# Patient Record
Sex: Female | Born: 1940 | Race: White | Hispanic: No | Marital: Married | Smoking: Never smoker
Health system: Southern US, Community
[De-identification: ages and names within clinical notes are randomized; demographics above are authoritative.]

## PROBLEM LIST (undated history)

## (undated) DIAGNOSIS — M1990 Unspecified osteoarthritis, unspecified site: Secondary | ICD-10-CM

## (undated) DIAGNOSIS — E785 Hyperlipidemia, unspecified: Secondary | ICD-10-CM

## (undated) DIAGNOSIS — I10 Essential (primary) hypertension: Secondary | ICD-10-CM

## (undated) DIAGNOSIS — G4730 Sleep apnea, unspecified: Secondary | ICD-10-CM

## (undated) DIAGNOSIS — C801 Malignant (primary) neoplasm, unspecified: Secondary | ICD-10-CM

## (undated) HISTORY — PX: CHOLECYSTECTOMY: SHX55

## (undated) HISTORY — DX: Essential (primary) hypertension: I10

## (undated) HISTORY — PX: VAGINAL HYSTERECTOMY: SUR661

## (undated) HISTORY — DX: Hyperlipidemia, unspecified: E78.5

## (undated) HISTORY — PX: OOPHORECTOMY: SHX6387

## (undated) HISTORY — DX: Sleep apnea, unspecified: G47.30

---

## 1998-10-02 ENCOUNTER — Other Ambulatory Visit: Admission: RE | Admit: 1998-10-02 | Discharge: 1998-10-02 | Payer: Self-pay | Admitting: Obstetrics and Gynecology

## 1998-10-18 ENCOUNTER — Other Ambulatory Visit: Admission: RE | Admit: 1998-10-18 | Discharge: 1998-10-18 | Payer: Self-pay | Admitting: *Deleted

## 2001-11-20 ENCOUNTER — Other Ambulatory Visit: Admission: RE | Admit: 2001-11-20 | Discharge: 2001-11-20 | Payer: Self-pay | Admitting: Obstetrics and Gynecology

## 2002-10-17 ENCOUNTER — Encounter: Payer: Self-pay | Admitting: General Surgery

## 2002-10-21 ENCOUNTER — Observation Stay (HOSPITAL_COMMUNITY): Admission: RE | Admit: 2002-10-21 | Discharge: 2002-10-22 | Payer: Self-pay | Admitting: General Surgery

## 2002-10-21 ENCOUNTER — Encounter: Payer: Self-pay | Admitting: General Surgery

## 2002-10-22 ENCOUNTER — Encounter (INDEPENDENT_AMBULATORY_CARE_PROVIDER_SITE_OTHER): Payer: Self-pay | Admitting: Specialist

## 2003-04-02 ENCOUNTER — Encounter: Payer: Self-pay | Admitting: Emergency Medicine

## 2003-04-02 ENCOUNTER — Emergency Department (HOSPITAL_COMMUNITY): Admission: EM | Admit: 2003-04-02 | Discharge: 2003-04-02 | Payer: Self-pay | Admitting: Emergency Medicine

## 2003-04-08 ENCOUNTER — Encounter: Admission: RE | Admit: 2003-04-08 | Discharge: 2003-05-06 | Payer: Self-pay | Admitting: Specialist

## 2003-05-29 ENCOUNTER — Other Ambulatory Visit: Admission: RE | Admit: 2003-05-29 | Discharge: 2003-05-29 | Payer: Self-pay | Admitting: Obstetrics and Gynecology

## 2005-01-10 ENCOUNTER — Encounter: Admission: RE | Admit: 2005-01-10 | Discharge: 2005-01-10 | Payer: Self-pay | Admitting: Obstetrics and Gynecology

## 2005-03-24 ENCOUNTER — Other Ambulatory Visit: Admission: RE | Admit: 2005-03-24 | Discharge: 2005-03-24 | Payer: Self-pay | Admitting: Obstetrics and Gynecology

## 2005-06-07 ENCOUNTER — Ambulatory Visit (HOSPITAL_COMMUNITY): Admission: RE | Admit: 2005-06-07 | Discharge: 2005-06-07 | Payer: Self-pay | Admitting: Gastroenterology

## 2005-06-07 ENCOUNTER — Encounter (INDEPENDENT_AMBULATORY_CARE_PROVIDER_SITE_OTHER): Payer: Self-pay | Admitting: *Deleted

## 2006-06-30 ENCOUNTER — Encounter: Admission: RE | Admit: 2006-06-30 | Discharge: 2006-06-30 | Payer: Self-pay | Admitting: Obstetrics and Gynecology

## 2009-01-01 ENCOUNTER — Encounter: Admission: RE | Admit: 2009-01-01 | Discharge: 2009-01-01 | Payer: Self-pay | Admitting: Obstetrics and Gynecology

## 2010-01-07 ENCOUNTER — Encounter: Admission: RE | Admit: 2010-01-07 | Discharge: 2010-01-07 | Payer: Self-pay | Admitting: Family Medicine

## 2011-01-06 ENCOUNTER — Other Ambulatory Visit: Payer: Self-pay | Admitting: Family Medicine

## 2011-01-06 DIAGNOSIS — Z139 Encounter for screening, unspecified: Secondary | ICD-10-CM

## 2011-01-12 ENCOUNTER — Other Ambulatory Visit: Payer: Self-pay | Admitting: Gastroenterology

## 2011-01-14 NOTE — Op Note (Signed)
Kiara Zamora, Kiara Zamora              ACCOUNT NO.:  [id number]   MEDICAL RECORD NO.:  [id number]          PATIENT TYPE:  AMB   LOCATION:  ENDO                         FACILITY:  [facility]   PHYSICIAN:  Bernette Redbird, M.D.   DATE OF BIRTH:  Jun 24, 1941   DATE OF PROCEDURE:  06/07/2005  DATE OF DISCHARGE:                                 OPERATIVE REPORT   PROCEDURE:  Colonoscopy with polypectomy and biopsy.   INDICATIONS:  A 70 year old female for initial colon cancer screening. There  is a possible family history of colon cancer in her father.   FINDINGS:  Two small polyps removed.   DESCRIPTION OF PROCEDURE:  The nature, purpose and risks of the procedure  had been discussed with the patient who provided written consent. Sedation  was fentanyl 100 mcg and Versed 8 mg IV without arrhythmias or desaturation.   The Olympus adjustable tension pediatric video colonoscope was advanced with  moderate looping around colon to the terminal ileum (it is felt that the  adult colonoscope may be more suitable on future occasions). External  abdominal compression was used to control looping and facilitate  advancement.   In the cecum was a 5 mm semipedunculated polyp removed by cold snare  technique and retrieved by suctioning through the scope. In the mid rectum  was a 3-mm sessile polyp removed by cold biopsy. No other polyps were seen  and there was no evidence of cancer, colitis, vascular malformations or  diverticulosis. The quality of the prep was excellent and it is felt that  all areas were well seen. Retroflexion in the rectum was unremarkable.   The patient tolerated the procedure well and there were no apparent  complications.   IMPRESSION:  Small colon polyps removed as described above. Possible family  history of colon cancer.   PLAN:  Await pathology results. Probable colonoscopic follow-up in 5 years.           ______________________________  Bernette Redbird, M.D.     RB/MEDQ   D:  06/07/2005  T:  06/07/2005  Job:  [id number]   cc:   Duke Salvia. Marcelle Overlie, M.D.  Fax: [phone]

## 2011-01-14 NOTE — Op Note (Signed)
NAMEPANHIA, KARL                          ACCOUNT NO.:  [id number]   MEDICAL RECORD NO.:  [id number]                   PATIENT TYPE:  AMB   LOCATION:  DAY                                  FACILITY:  [facility]   PHYSICIAN:  Ollen Gross. Vernell Morgans, M.D.              DATE OF BIRTH:  08-14-1941   DATE OF PROCEDURE:  10/21/2002  DATE OF DISCHARGE:                                 OPERATIVE REPORT   PREOPERATIVE DIAGNOSIS:  Cholelithiasis .   POSTOPERATIVE DIAGNOSES:  Cholecystitis with cholelithiasis.   PROCEDURE:  Laparoscopic cholecystectomy with interoperative cholangiogram.   SURGEON:  Ollen Gross. Carolynne Edouard, M.D.   ASSISTANT:  Donnie Coffin. Samuella Cota, M.D.   ANESTHESIA:  General endotracheal.   DESCRIPTION OF PROCEDURE:  After informed consent was obtained, the patient  was brought to the operating room and placed in the supine position on the  operating table.  After adequate general anesthesia, the patient's abdomen  was prepped with Betadine and draped in the usual sterile manner.  The area  below the umbilicus was infiltrated with 0.25% Marcaine, and a small  incision was made with a 15 blade knife.  This incision was carried down  through the subcutaneous tissue bluntly with the Kelly clamp and [employer]  retractors, until the linea alba was identified.  The linea alba was incised  with a 15 blade knife, and each side was grasped with Kocher clamps and  elevated anteriorly.  The preperitoneal space was then probed bluntly with a  hemostat until the peritoneum was opened, and access was gained to the  abdominal cavity.  A 0 Vicryl pursestring stitch was placed in the fascia  surrounding this opening, and a Hasson cannula was placed through the  opening and anchored in place with the previously-placed Vicryl pursestring  stitch.  The abdomen was then insufflated with carbon dioxide without  difficulty.  The patient was placed in a head-up position, and a laparoscope  was placed through the Hasson  cannula.  The dome of the gallbladder and the  liver edge were readily identified.  The epigastric region was then  infiltrated with 0.25% Marcaine, and a small incision was made with a 15  blade knife.  A 10-mm port was placed bluntly through this incision into the  abdominal cavity under direct vision.  Sites were then chosen laterally in  the right side of the abdomen for placement of 5-mm ports.  Each of these  areas was infiltrated with 0.25% Marcaine.  Small stab incisions were made  with a 15 blade knife, and 5-mm ports were placed bluntly through these  incisions into the abdominal cavity under direct vision.  The gallbladder  was very tense and distended.  The gallbladder was decompressed using a  needle aspiration device.  Once this was complete, the dome of the  gallbladder was able to be grasped with a blunt grasper and elevated  anteriorly and  superiorly.  A blunt grasper was placed through the other 5-  mm port and used to retract the body and neck of the gallbladder.  The  dissector was used to open the peritoneal reflection at the gallbladder neck  area using the electrocautery, and then blunt dissection was then carried  out in this area until the gallbladder neck/cystic duct junction was readily  identified.  Once this was identified and dissected circumferentially until  a good window was created, a single clip was placed on the gallbladder neck,  and a ductotomy was made with the laparoscopic scissors, and a 12-gauge  Angiocath was placed percutaneously through the anterior abdominal wall.  A  Reddick cholangiogram catheter was placed through the Angiocath and flushed,  and the catheter was then placed within the cystic duct and anchored in  place with a clip.  A cholangiogram was obtained that showed no filling  defects, a short cystic duct, and good emptying into the duodenum.  The  anchor clip and catheters were then removed from the patient.  Three clips  were placed  proximally on the cystic duct, and the duct was divided between  the two sets of clips.  There were multiple branches of the arteries  identified posterior to this, and each branch was dissected  circumferentially in a blunt manner.  Two clips were placed proximally and  one distally on each branch, and the branches were divided between the two  sets of clips.  Next, a laparoscopic Hook cautery device was used to  separate the gallbladder from the liver bed.  The gallbladder was  significantly inflamed, but was able to be separated from the liver bed with  the electrocautery.  Prior to completely detaching the liver bed, the liver  bed was inspected, and bleeding points were coagulated with the  electrocautery until the liver bed was completely hemostatic.  The  gallbladder was then detached the rest of the way from the liver bed.  An  endoscopic bag was placed through the epigastric port, and the gallbladder  was placed within the bag, and the bag was sealed.  The abdomen was then  irrigated with copious amounts of saline until the effluent was clear.  The  liver bed was inspected again and still found to be hemostatic.  The  laparoscope was then moved to the upper midline port, and the gallbladder  grasper was placed through the Hasson cannula and used to grasp the open end  of the bag.  The bag with the gallbladder was then removed with the Hasson  cannula through the infraumbilical port without difficulty.  The fascial  defect was then closed with the previously-placed Vicryl pursestring stitch.  The rest of the ports were removed under direct vision and were found to be  hemostatic.  The gas was allowed to escape.  The skin incisions were all  closed with interrupted 4-0 Monocryl subcuticular stitches.  Benzoin and  Steri-Strips were applied.  The patient tolerated the procedure well.  At the end of the case, all needle, sponge, and instrument counts were correct.  The patient was then  awakened and taken to the recovery room in stable  condition.                                               Ollen Gross. Vernell Morgans, M.D.  PST/MEDQ  D:  10/21/2002  T:  10/21/2002  Job:  [id number]

## 2011-01-18 ENCOUNTER — Ambulatory Visit
Admission: RE | Admit: 2011-01-18 | Discharge: 2011-01-18 | Disposition: A | Discharge status: Home / Self Care | Payer: Medicare Other | Source: Ambulatory Visit | Attending: Family Medicine | Admitting: Family Medicine

## 2011-01-18 DIAGNOSIS — Z139 Encounter for screening, unspecified: Secondary | ICD-10-CM

## 2011-12-19 ENCOUNTER — Other Ambulatory Visit: Payer: Self-pay | Admitting: Family Medicine

## 2011-12-19 DIAGNOSIS — Z1231 Encounter for screening mammogram for malignant neoplasm of breast: Secondary | ICD-10-CM

## 2012-01-19 ENCOUNTER — Ambulatory Visit: Payer: Medicare Other

## 2012-01-24 ENCOUNTER — Ambulatory Visit
Admission: RE | Admit: 2012-01-24 | Discharge: 2012-01-24 | Disposition: A | Discharge status: Home / Self Care | Payer: Medicare Other | Source: Ambulatory Visit | Attending: Family Medicine | Admitting: Family Medicine

## 2012-01-24 DIAGNOSIS — Z1231 Encounter for screening mammogram for malignant neoplasm of breast: Secondary | ICD-10-CM

## 2013-01-09 ENCOUNTER — Other Ambulatory Visit: Payer: Self-pay

## 2013-01-09 DIAGNOSIS — Z1231 Encounter for screening mammogram for malignant neoplasm of breast: Secondary | ICD-10-CM

## 2013-01-24 ENCOUNTER — Ambulatory Visit
Admission: RE | Admit: 2013-01-24 | Discharge: 2013-01-24 | Disposition: A | Discharge status: Home / Self Care | Payer: Medicare Other | Source: Ambulatory Visit

## 2013-01-24 ENCOUNTER — Ambulatory Visit: Payer: Medicare Other

## 2013-01-24 DIAGNOSIS — Z1231 Encounter for screening mammogram for malignant neoplasm of breast: Secondary | ICD-10-CM

## 2014-01-14 ENCOUNTER — Other Ambulatory Visit: Payer: Self-pay

## 2014-01-14 DIAGNOSIS — Z1231 Encounter for screening mammogram for malignant neoplasm of breast: Secondary | ICD-10-CM

## 2014-01-28 ENCOUNTER — Encounter (INDEPENDENT_AMBULATORY_CARE_PROVIDER_SITE_OTHER): Payer: Self-pay

## 2014-01-28 ENCOUNTER — Ambulatory Visit
Admission: RE | Admit: 2014-01-28 | Discharge: 2014-01-28 | Disposition: A | Discharge status: Home / Self Care | Payer: Medicare Other | Source: Ambulatory Visit

## 2014-01-28 DIAGNOSIS — Z1231 Encounter for screening mammogram for malignant neoplasm of breast: Secondary | ICD-10-CM

## 2014-02-04 ENCOUNTER — Encounter: Payer: Self-pay | Admitting: *Deleted

## 2014-02-07 ENCOUNTER — Encounter: Payer: Self-pay | Admitting: Cardiology

## 2014-02-07 ENCOUNTER — Ambulatory Visit (INDEPENDENT_AMBULATORY_CARE_PROVIDER_SITE_OTHER): Payer: Medicare Other | Admitting: Cardiology

## 2014-02-07 ENCOUNTER — Encounter (HOSPITAL_COMMUNITY): Payer: Self-pay | Admitting: *Deleted

## 2014-02-07 VITALS — BP 118/82 | HR 76 | Ht 64.0 in | Wt 229.0 lb

## 2014-02-07 DIAGNOSIS — E663 Overweight: Secondary | ICD-10-CM | POA: Insufficient documentation

## 2014-02-07 DIAGNOSIS — R0600 Dyspnea, unspecified: Secondary | ICD-10-CM

## 2014-02-07 DIAGNOSIS — R0789 Other chest pain: Secondary | ICD-10-CM

## 2014-02-07 DIAGNOSIS — R0602 Shortness of breath: Secondary | ICD-10-CM

## 2014-02-07 DIAGNOSIS — R0609 Other forms of dyspnea: Secondary | ICD-10-CM

## 2014-02-07 DIAGNOSIS — R0989 Other specified symptoms and signs involving the circulatory and respiratory systems: Secondary | ICD-10-CM

## 2014-02-07 NOTE — Progress Notes (Signed)
HPI The patient presents for evaluation of dyspnea and chest pain.  She reports that she has had dyspnea with exertion for months if not longer.  It may or may not be getting worse.  She reports that she is SOB after walking a moderate distance or up an incline.  She does not have SOB at rest and she has no PND or orthopnea.  She does not report neck or arm pain.  She does not have palpitations, presyncope or syncope.  She has no weight gain or edema.  She reports that she has some chest discomfort but this is really a sensation of not getting a good breath.  All of her symptoms improve with stopping and resting for a few minutes.  She does get some sharp shooting chest discomfort at times.  This has been happening for about 3 months and it wakes her but does not happen at night.    Allergies  Allergen Reactions  . Penicillins     RASH  . Sudafed [Pseudoephedrine Hcl]     [facility]    Current Outpatient Prescriptions  Medication Sig Dispense Refill  . amLODipine (NORVASC) 10 MG tablet TAKE 1 TAB DAILY      . aspirin 81 MG tablet Take 81 mg by mouth daily.      . carvedilol (COREG) 6.25 MG tablet TAKE 1 TAB DAILY      . cholecalciferol (VITAMIN D) 1000 UNITS tablet Take 2,000 Units by mouth daily.      Marland Kitchen lisinopril-hydrochlorothiazide (PRINZIDE,ZESTORETIC) 20-25 MG per tablet 1 TAB DAILY      . NEXIUM 40 MG capsule I TAB DAILY      . TOVIAZ 8 MG TB24 tablet 1 TAB DAILY      . vitamin B-12 (CYANOCOBALAMIN) 1000 MCG tablet Take 1,000 mcg by mouth daily.       No current facility-administered medications for this visit.    Past Medical History  Diagnosis Date  . Sleep apnea     CPAP  . Hypertension   . Hyperlipidemia     Past Surgical History  Procedure Laterality Date  . Vaginal hysterectomy    . Cholecystectomy    . Oophorectomy      Family History  Problem Relation Age of Onset  . Colon cancer Father   . Hypertension Mother   . Esophageal cancer Mother     History    Social History  . Marital Status: Married    Spouse Name: N/A    Number of Children: 3  . Years of Education: N/A   Occupational History  . Not on file.   Social History Main Topics  . Smoking status: Never Smoker   . Smokeless tobacco: Not on file  . Alcohol Use: No  . Drug Use: No  . Sexual Activity: Yes    Partners: Male   Other Topics Concern  . Not on file   Social History Narrative   Lives at home with husband.     ROS:  Positive for reflux, fibromyalgia, extremity swelling.  Otherwise as stated in the HPI and negative for all other systems.  PHYSICAL EXAM BP 118/82  Pulse 76  Ht 5\' 4"  (1.626 m)  Wt 229 lb (103.874 kg)  BMI 39.29 kg/m2 GENERAL:  Well appearing HEENT:  Pupils equal round and reactive, fundi not visualized, oral mucosa unremarkable NECK:  No jugular venous distention, waveform within normal limits, carotid upstroke brisk and symmetric, no bruits, no thyromegaly LYMPHATICS:  No cervical,  inguinal adenopathy LUNGS:  Clear to auscultation bilaterally BACK:  No CVA tenderness CHEST:  Unremarkable HEART:  PMI not displaced or sustained,S1 and S2 within normal limits, no S3, no S4, no clicks, no rubs, no murmurs ABD:  Flat, positive bowel sounds normal in frequency in pitch, no bruits, no rebound, no guarding, no midline pulsatile mass, no hepatomegaly, no splenomegaly EXT:  2 plus pulses throughout, no edema, no cyanosis no clubbing SKIN:  No rashes no nodules NEURO:  Cranial nerves II through XII grossly intact, motor grossly intact throughout PSYCH:  Cognitively intact, oriented to person place and time   EKG:  Sinus rhythm, rate 79, leftward axis, intervals within normal limits, poor anterior R wave progression, no acute ST-T wave changes.  ASSESSMENT AND PLAN  DYSPNEA:  I suspect that this is multifactorial.  This could be related to weight and deconditioning. I will order a BNP.  If this is abnormal she will likely need a follow up echo.  We  did discuss weight loss and exercise in some detail.    CHEST PAIN:  She has atypical rather than typical symptoms.  However, with risk factors she needs screening for obstructive CAD.  I will bring the patient back for a POET (Plain Old Exercise Test). This will allow me to screen for obstructive coronary disease, risk stratify and very importantly provide a prescription for exercise.  OVERWEIGHT:  The patient understands the need to lose weight with diet and exercise. We have discussed specific strategies for this as above.

## 2014-02-07 NOTE — Patient Instructions (Signed)
The current medical regimen is effective;  continue present plan and medications.  Your physician has requested that you have an exercise tolerance test. For further information please visit [email or web address]. Please also follow instruction sheet, as given.  Please have blood work today on the 1 st floor [employer])  Follow up as needed.

## 2014-02-08 LAB — BRAIN NATRIURETIC PEPTIDE: Brain Natriuretic Peptide: 28.6 pg/mL (ref 0.0–100.0)

## 2014-02-11 ENCOUNTER — Telehealth (HOSPITAL_COMMUNITY): Payer: Self-pay

## 2014-02-13 ENCOUNTER — Ambulatory Visit (HOSPITAL_COMMUNITY)
Admission: RE | Admit: 2014-02-13 | Discharge: 2014-02-13 | Disposition: A | Discharge status: Home / Self Care | Payer: Medicare Other | Source: Ambulatory Visit | Attending: Cardiovascular Disease | Admitting: Cardiovascular Disease

## 2014-02-13 DIAGNOSIS — R0789 Other chest pain: Secondary | ICD-10-CM | POA: Insufficient documentation

## 2014-02-20 ENCOUNTER — Telehealth: Payer: Self-pay | Admitting: *Deleted

## 2014-02-20 NOTE — Telephone Encounter (Signed)
I think I answered this already. No further ischemia work up. ----- Message ----- From: Kendra Opitz. Moffitt Sent: 02/17/2014 8:26 AM To: Minus Breeding, MD  Left message for pt that no further work up is needed and to call back if questions or concerns.

## 2014-02-27 NOTE — Telephone Encounter (Signed)
Encounter complete. 

## 2014-03-03 ENCOUNTER — Encounter (HOSPITAL_COMMUNITY): Payer: Self-pay | Admitting: *Deleted

## 2014-03-04 ENCOUNTER — Encounter (HOSPITAL_COMMUNITY): Payer: Self-pay | Admitting: *Deleted

## 2014-03-13 NOTE — Telephone Encounter (Signed)
Encounter complete. 

## 2015-02-20 ENCOUNTER — Other Ambulatory Visit: Payer: Self-pay

## 2015-02-20 DIAGNOSIS — Z1231 Encounter for screening mammogram for malignant neoplasm of breast: Secondary | ICD-10-CM

## 2015-03-26 ENCOUNTER — Ambulatory Visit
Admission: RE | Admit: 2015-03-26 | Discharge: 2015-03-26 | Disposition: A | Discharge status: Home / Self Care | Payer: Medicare Other | Source: Ambulatory Visit

## 2015-03-26 DIAGNOSIS — Z1231 Encounter for screening mammogram for malignant neoplasm of breast: Secondary | ICD-10-CM

## 2015-11-09 ENCOUNTER — Other Ambulatory Visit: Payer: Self-pay | Admitting: Gastroenterology

## 2015-11-09 DIAGNOSIS — R1310 Dysphagia, unspecified: Secondary | ICD-10-CM

## 2015-11-13 ENCOUNTER — Ambulatory Visit
Admission: RE | Admit: 2015-11-13 | Discharge: 2015-11-13 | Disposition: A | Discharge status: Home / Self Care | Payer: Medicare Other | Source: Ambulatory Visit | Attending: Gastroenterology | Admitting: Gastroenterology

## 2015-11-13 DIAGNOSIS — R1310 Dysphagia, unspecified: Secondary | ICD-10-CM

## 2016-05-16 ENCOUNTER — Other Ambulatory Visit: Payer: Self-pay | Admitting: Family Medicine

## 2016-05-16 DIAGNOSIS — Z1231 Encounter for screening mammogram for malignant neoplasm of breast: Secondary | ICD-10-CM

## 2016-05-26 ENCOUNTER — Ambulatory Visit
Admission: RE | Admit: 2016-05-26 | Discharge: 2016-05-26 | Disposition: A | Discharge status: Home / Self Care | Payer: Medicare Other | Source: Ambulatory Visit | Attending: Family Medicine | Admitting: Family Medicine

## 2016-05-26 DIAGNOSIS — Z1231 Encounter for screening mammogram for malignant neoplasm of breast: Secondary | ICD-10-CM

## 2017-06-05 HISTORY — PX: ESOPHAGOGASTRODUODENOSCOPY: SHX1529

## 2017-06-09 ENCOUNTER — Emergency Department (HOSPITAL_COMMUNITY)
Admission: EM | Admit: 2017-06-09 | Discharge: 2017-06-09 | Disposition: A | Discharge status: Home / Self Care | Payer: Medicare Other | Attending: Emergency Medicine | Admitting: Emergency Medicine

## 2017-06-09 ENCOUNTER — Encounter (HOSPITAL_COMMUNITY): Payer: Self-pay | Admitting: Emergency Medicine

## 2017-06-09 ENCOUNTER — Emergency Department (HOSPITAL_COMMUNITY): Payer: Medicare Other

## 2017-06-09 DIAGNOSIS — Y999 Unspecified external cause status: Secondary | ICD-10-CM | POA: Diagnosis not present

## 2017-06-09 DIAGNOSIS — I10 Essential (primary) hypertension: Secondary | ICD-10-CM | POA: Insufficient documentation

## 2017-06-09 DIAGNOSIS — Z79899 Other long term (current) drug therapy: Secondary | ICD-10-CM | POA: Insufficient documentation

## 2017-06-09 DIAGNOSIS — S62627A Displaced fracture of medial phalanx of left little finger, initial encounter for closed fracture: Secondary | ICD-10-CM | POA: Diagnosis not present

## 2017-06-09 DIAGNOSIS — W010XXA Fall on same level from slipping, tripping and stumbling without subsequent striking against object, initial encounter: Secondary | ICD-10-CM | POA: Diagnosis not present

## 2017-06-09 DIAGNOSIS — S6992XA Unspecified injury of left wrist, hand and finger(s), initial encounter: Secondary | ICD-10-CM | POA: Diagnosis present

## 2017-06-09 DIAGNOSIS — S42291A Other displaced fracture of upper end of right humerus, initial encounter for closed fracture: Secondary | ICD-10-CM

## 2017-06-09 DIAGNOSIS — Y929 Unspecified place or not applicable: Secondary | ICD-10-CM | POA: Diagnosis not present

## 2017-06-09 DIAGNOSIS — Z7982 Long term (current) use of aspirin: Secondary | ICD-10-CM | POA: Diagnosis not present

## 2017-06-09 DIAGNOSIS — Y939 Activity, unspecified: Secondary | ICD-10-CM | POA: Insufficient documentation

## 2017-06-09 MED ORDER — KETOROLAC TROMETHAMINE 15 MG/ML IJ SOLN
15.0000 mg | Freq: Once | INTRAMUSCULAR | Status: AC
  Administered 2017-06-09: 15 mg via INTRAMUSCULAR
  Filled 2017-06-09: qty 1

## 2017-06-09 MED ORDER — HYDROMORPHONE HCL 1 MG/ML IJ SOLN
1.0000 mg | Freq: Once | INTRAMUSCULAR | Status: AC
  Administered 2017-06-09: 1 mg via INTRAMUSCULAR
  Filled 2017-06-09: qty 1

## 2017-06-09 MED ORDER — PROMETHAZINE HCL 25 MG/ML IJ SOLN
12.5000 mg | Freq: Once | INTRAMUSCULAR | Status: AC
  Administered 2017-06-09: 12.5 mg via INTRAMUSCULAR
  Filled 2017-06-09: qty 1

## 2017-06-09 MED ORDER — ACETAMINOPHEN-CODEINE #3 300-30 MG PO TABS: 1.0000 | ORAL_TABLET | Freq: Four times a day (QID) | ORAL | 0 refills | Status: DC | PRN

## 2017-06-09 MED ORDER — DIAZEPAM 5 MG PO TABS: 2.5000 mg | ORAL_TABLET | Freq: Three times a day (TID) | ORAL | 0 refills | Status: DC | PRN

## 2017-06-09 MED ORDER — ONDANSETRON 4 MG PO TBDP
4.0000 mg | ORAL_TABLET | Freq: Once | ORAL | Status: AC
  Administered 2017-06-09: 4 mg via ORAL
  Filled 2017-06-09: qty 1

## 2017-06-09 NOTE — ED Notes (Signed)
ED Provider at bedside. 

## 2017-06-09 NOTE — ED Notes (Signed)
Patient transported to X-ray 

## 2017-06-09 NOTE — ED Notes (Signed)
Bed: NI77 Expected date:  Expected time:  Means of arrival:  Comments: 31F R shoulder pain r/t fall

## 2017-06-09 NOTE — Discharge Instructions (Signed)
You are not being splinted. You may remove sling for bathing purposes.

## 2017-06-09 NOTE — ED Triage Notes (Signed)
[facility]- pt with trip and fall on to right shoulder and left pinky. No deformity noted. Immobilized on arrival. No blood thinners. A/O Pain 9/10.

## 2017-06-14 NOTE — ED Provider Notes (Signed)
[facility] DEPT Provider Note   CSN: [id number] Arrival date & time: 06/09/17  3818     History   Chief Complaint Chief Complaint  Patient presents with  . Fall  . Shoulder Pain    HPI Kiara Zamora is a 76 y.o. female.  HPI   76 year old female presenting after fall. Mechanical. Tripped and lost her balance. She complaining of pain in her right upper arm/shoulder and left little finger. Denies any acute headache, neck or back pain. No numbness or tingling.  Past Medical History:  Diagnosis Date  . Hyperlipidemia   . Hypertension   . Sleep apnea    CPAP    Patient Active Problem List   Diagnosis Date Noted  . Dyspnea 02/07/2014  . Overweight(278.02) 02/07/2014    Past Surgical History:  Procedure Laterality Date  . CHOLECYSTECTOMY    . ESOPHAGOGASTRODUODENOSCOPY  06/05/2017  . OOPHORECTOMY    . VAGINAL HYSTERECTOMY      OB History    No data available       Home Medications    Prior to Admission medications   Medication Sig Start Date End Date Taking? Authorizing Provider  amLODipine (NORVASC) 10 MG tablet TAKE 1 TAB DAILY 01/21/14  Yes [provider]  aspirin 81 MG tablet Take 81 mg by mouth daily.   Yes [provider]  carvedilol (COREG) 6.25 MG tablet TAKE 1 TAB DAILY 01/21/14  Yes [provider]  cholecalciferol (VITAMIN D) 1000 UNITS tablet Take 2,000 Units by mouth daily.   Yes [provider]  lisinopril-hydrochlorothiazide (PRINZIDE,ZESTORETIC) 20-25 MG per tablet 1 TAB DAILY 01/21/14  Yes [provider]  pantoprazole (PROTONIX) 40 MG tablet Take 40 mg by mouth daily. 05/02/17  Yes [provider]  Propylene Glycol 0.95 % SOLN Apply 1-2 drops to eye daily as needed (dry eyes).   Yes [provider]  VESICARE 10 MG tablet Take 10 mg by mouth daily as needed (bladder control).  05/02/17  Yes [provider]  vitamin B-12 (CYANOCOBALAMIN) 1000  MCG tablet Take 1,000 mcg by mouth daily.   Yes [provider]  acetaminophen-codeine (TYLENOL #3) 300-30 MG tablet Take 1-2 tablets by mouth every 6 (six) hours as needed for moderate pain. 06/09/17   Virgel Manifold, MD  diazepam (VALIUM) 5 MG tablet Take 0.5 tablets (2.5 mg total) by mouth every 8 (eight) hours as needed for muscle spasms. 06/09/17   Virgel Manifold, MD    Family History Family History  Problem Relation Age of Onset  . Hypertension Mother   . Esophageal cancer Mother   . Colon cancer Father     Social History Social History  Substance Use Topics  . Smoking status: Never Smoker  . Smokeless tobacco: Never Used  . Alcohol use No     Allergies   Penicillins and Sudafed [pseudoephedrine hcl]   Review of Systems Review of Systems  All systems reviewed and negative, other than as noted in HPI.  Physical Exam Updated Vital Signs BP (!) 170/84   Pulse 78   Temp 97.8 F (36.6 C) (Oral)   Resp 19   Ht 5\' 5"  (1.651 m)   Wt 101.6 kg (224 lb)   SpO2 95%   BMI 37.28 kg/m   Physical Exam  Constitutional: She appears well-developed and well-nourished. No distress.  HENT:  Head: Normocephalic and atraumatic.  Eyes: Conjunctivae are normal. Right eye exhibits no discharge. Left eye exhibits no discharge.  Neck:  Neck supple.  Cardiovascular: Normal rate, regular rhythm and normal heart sounds.  Exam reveals no gallop and no friction rub.   No murmur heard. Pulmonary/Chest: Effort normal and breath sounds normal. No respiratory distress.  Abdominal: Soft. She exhibits no distension. There is no tenderness.  Musculoskeletal: She exhibits no edema or tenderness.  Pain and minimal deformity to right little finger in the mid/proximal phalanx. Reduced range of motion secondary to pain. Sensation is intact bilaterally in the fingertip. Good cap refill. Significant tenderness mid to proximal right humerus and lateral shoulder. Does not range secondary to pain.  No resting tach distally. No midline spinal tenderness.  Neurological: She is alert.  Skin: Skin is warm and dry.  Psychiatric: She has a normal mood and affect. Her behavior is normal. Thought content normal.  Nursing note and vitals reviewed.    ED Treatments / Results  Labs (all labs ordered are listed, but only abnormal results are displayed) Labs Reviewed - No data to display  EKG  EKG Interpretation None       Radiology No results found.   Dg Shoulder Right  Result Date: 06/09/2017 CLINICAL DATA:  76 year old female with a history of trip and fall EXAM: RIGHT SHOULDER - 2+ VIEW COMPARISON:  None. FINDINGS: Irregular contour of the humeral head without displaced fracture fragment identified. Glenohumeral joint appears congruent. Degenerative changes of the acromioclavicular joint. IMPRESSION: Irregular contour of the humeral head may reflect chronic degenerative changes, however, nondisplaced fracture could have this appearance. If there is clinical concern for occult fracture, CT may be considered. Degenerative changes of the acromioclavicular joint Electronically Signed   By: Corrie Mckusick D.O.   On: 06/09/2017 09:42   Ct Shoulder Right Wo Contrast  Result Date: 06/09/2017 CLINICAL DATA:  Right shoulder pain status post fall. EXAM: CT OF THE UPPER RIGHT EXTREMITY WITHOUT CONTRAST TECHNIQUE: Multidetector CT imaging of the upper right extremity was performed according to the standard protocol. COMPARISON:  None. FINDINGS: Bones/Joint/Cartilage Nondisplaced fracture of the surgical neck of the right proximal humerus extending into the humeral head. Comminuted fracture of the humeral head involving the greater tuberosity with 1-2 mm of peripheral displacement. No other fracture or dislocation. Mild arthropathy of the glenohumeral joint. Mild arthropathy of the acromioclavicular joint. Type II acromion. Normal alignment. No joint effusion. Ligaments Ligaments are suboptimally  evaluated by CT. Muscles and Tendons Muscles are normal.  No muscle atrophy. Soft tissue No fluid collection or hematoma.  No soft tissue mass. IMPRESSION: 1. Acute nondisplaced fracture of the surgical neck of the right proximal humerus extending into the humeral head. Comminuted fracture of the humeral head involving the greater tuberosity with 1-2 mm of peripheral displacement. Electronically Signed   By: Kathreen Devoid   On: 06/09/2017 10:55   Dg Finger Little Left  Result Date: 06/09/2017 CLINICAL DATA:  Fall tripped on step at home this a.m., landing on rt elbow and tried to catch self with lt hand complains of severe rt shoulder pain, lt little finger pain with slight deformity EXAM: LEFT LITTLE FINGER 2+V COMPARISON:  None. FINDINGS: There is an acute fracture of the mid aspect of the fifth middle phalanx, associated with contour deformity and soft tissue swelling. No evidence for articular surface involvement. IMPRESSION: Fracture of the middle phalanx of the fifth digit. Electronically Signed   By: Nolon Nations M.D.   On: 06/09/2017 09:40    Procedures Procedures (including critical care time)  Medications Ordered in ED Medications  HYDROmorphone (DILAUDID)  injection 1 mg (1 mg Intramuscular Given 06/09/17 1008)  ondansetron (ZOFRAN-ODT) disintegrating tablet 4 mg (4 mg Oral Given 06/09/17 1007)  ketorolac (TORADOL) 15 MG/ML injection 15 mg (15 mg Intramuscular Given 06/09/17 1008)  promethazine (PHENERGAN) injection 12.5 mg (12.5 mg Intramuscular Given 06/09/17 1211)     Initial Impression / Assessment and Plan / ED Course  I have reviewed the triage vital signs and the nursing notes.  Pertinent labs & imaging results that were available during my care of the patient were reviewed by me and considered in my medical decision making (see chart for details).     Left proximal humerus fracture and left pinky fracture. No resting tach. Closed injuries. Splint for finger. Sling for  arm. When necessary pain medication. Orthopedic follow-up.  Final Clinical Impressions(s) / ED Diagnoses   Final diagnoses:  Closed fracture of middle phalanx of left little finger, initial encounter  Closed fracture of head of right humerus, initial encounter    New Prescriptions Discharge Medication List as of 06/09/2017  1:00 PM    START taking these medications   Details  acetaminophen-codeine (TYLENOL #3) 300-30 MG tablet Take 1-2 tablets by mouth every 6 (six) hours as needed for moderate pain., Starting Fri 06/09/2017, Print    diazepam (VALIUM) 5 MG tablet Take 0.5 tablets (2.5 mg total) by mouth every 8 (eight) hours as needed for muscle spasms., Starting Fri 06/09/2017, Print         Virgel Manifold, MD 06/14/17 1040

## 2017-11-22 ENCOUNTER — Other Ambulatory Visit: Payer: Self-pay | Admitting: Family Medicine

## 2017-11-22 DIAGNOSIS — Z1231 Encounter for screening mammogram for malignant neoplasm of breast: Secondary | ICD-10-CM

## 2017-12-13 ENCOUNTER — Ambulatory Visit
Admission: RE | Admit: 2017-12-13 | Discharge: 2017-12-13 | Disposition: A | Discharge status: Home / Self Care | Payer: Medicare Other | Source: Ambulatory Visit | Attending: Family Medicine | Admitting: Family Medicine

## 2017-12-13 DIAGNOSIS — Z1231 Encounter for screening mammogram for malignant neoplasm of breast: Secondary | ICD-10-CM

## 2018-10-13 IMAGING — MG DIGITAL SCREENING BILATERAL MAMMOGRAM WITH TOMO AND CAD
8 series · 8 of 24 positions shown · non-contrast
Comparison: Previous exam(s).

CLINICAL DATA: Screening.

EXAM:
DIGITAL SCREENING BILATERAL MAMMOGRAM WITH TOMO AND CAD

[R CC synth-2D]
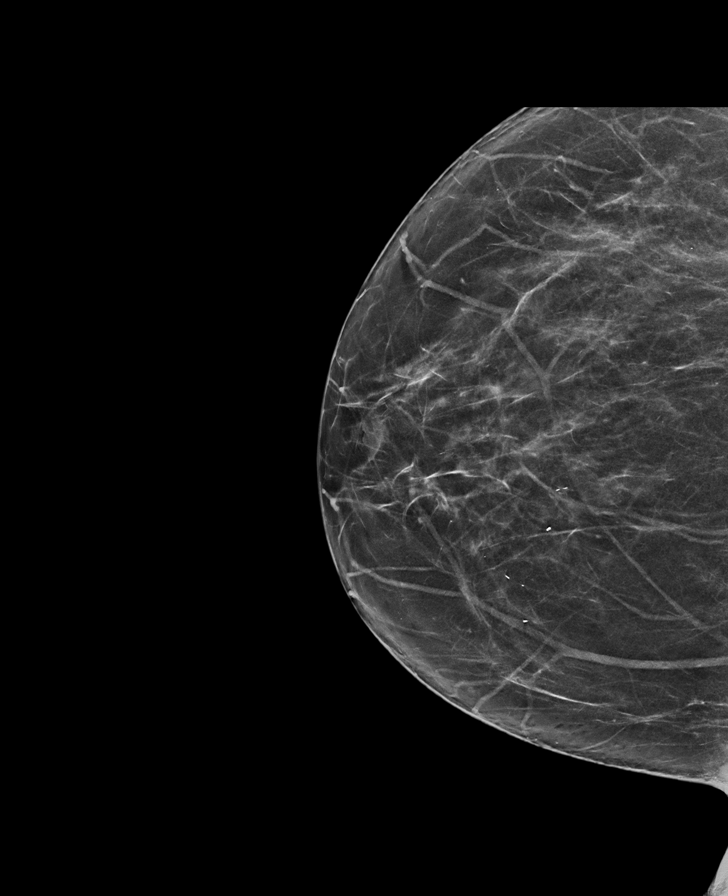

[L MLO synth-2D]
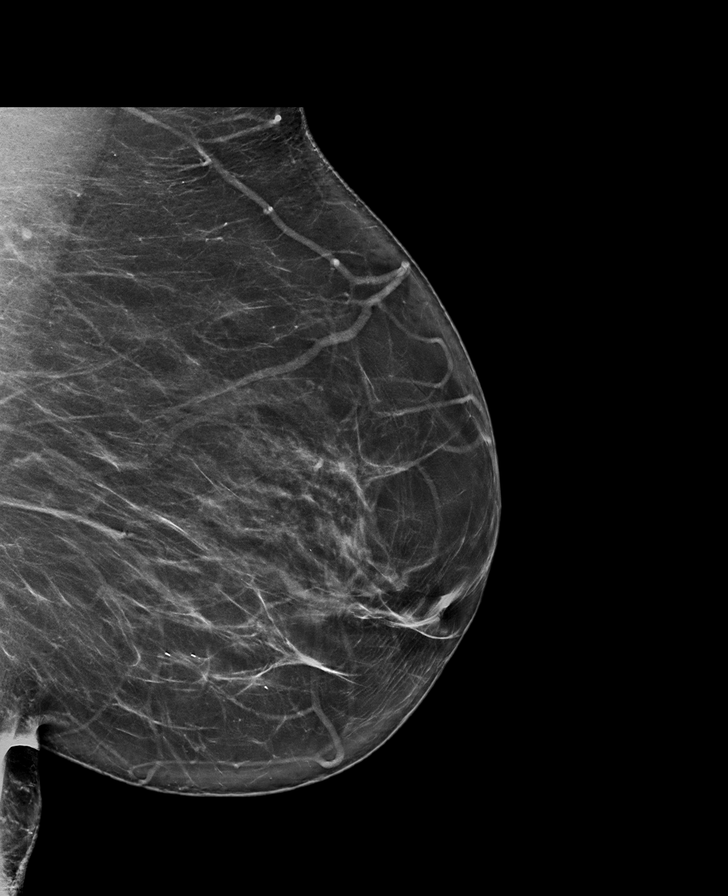

[R MLO synth-2D]
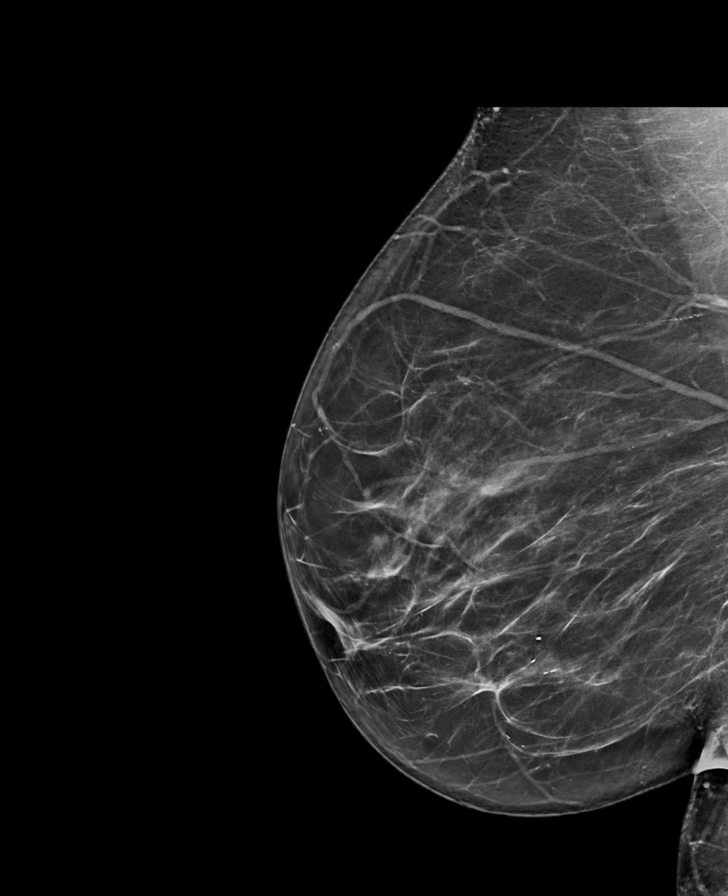

[L CC synth-2D]
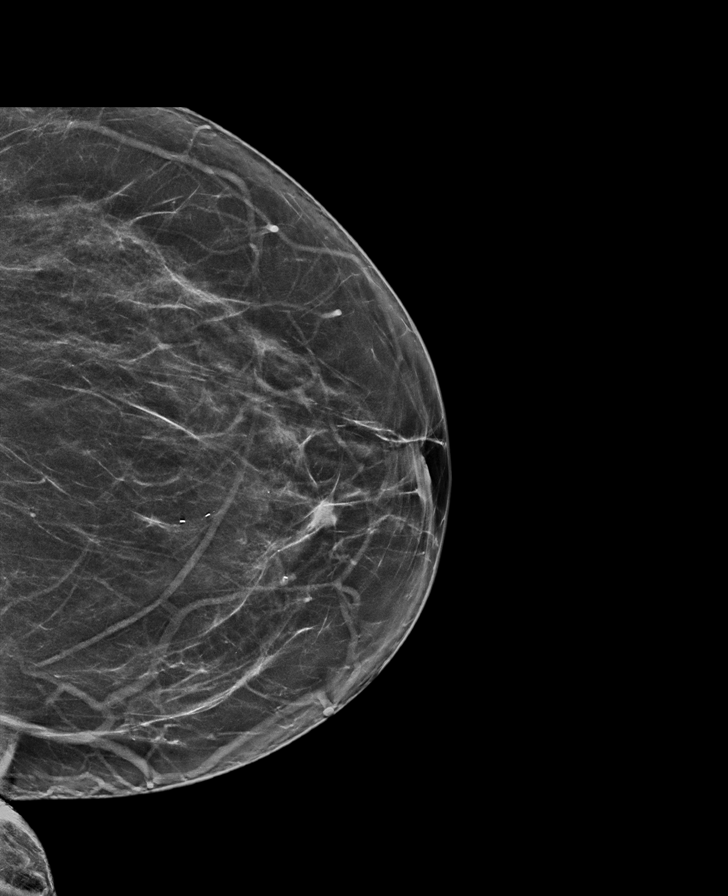

[R MLO tomo · tomo slice 39/77.0]
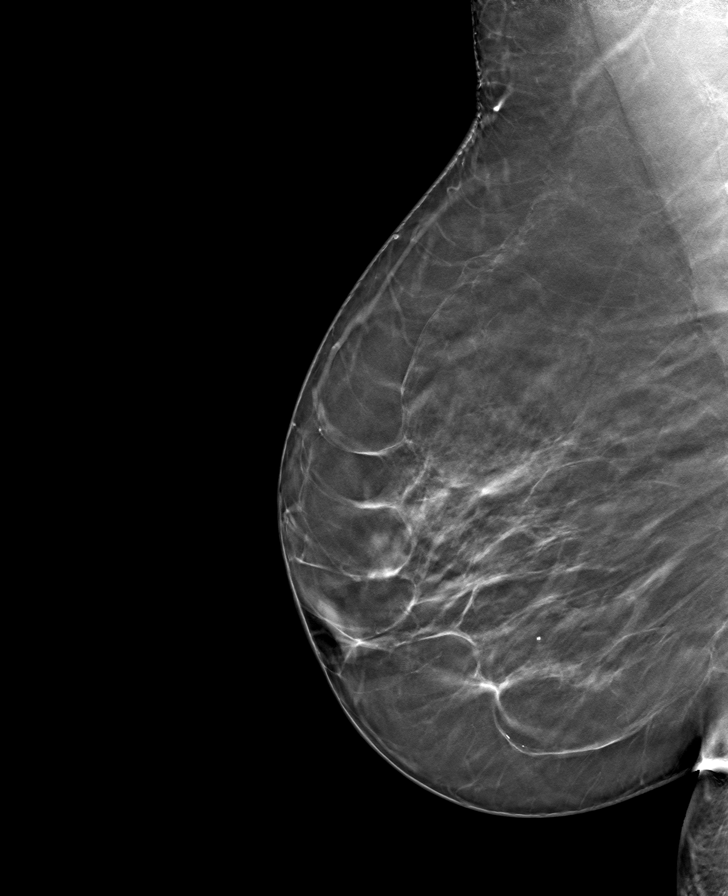

[L MLO tomo · tomo slice 41/80.0]
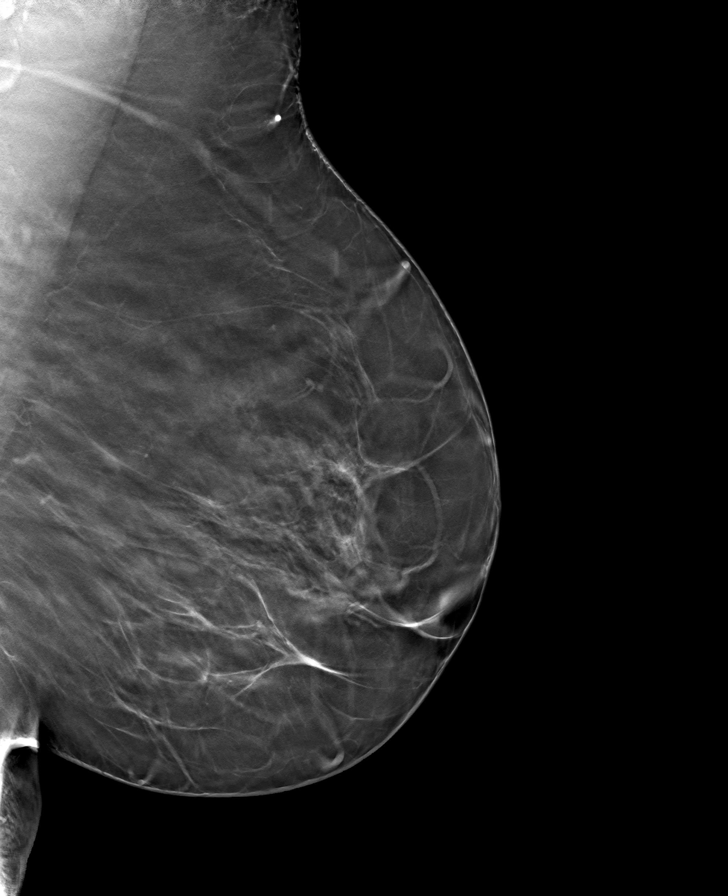

[R CC tomo · tomo slice 33/66.0]
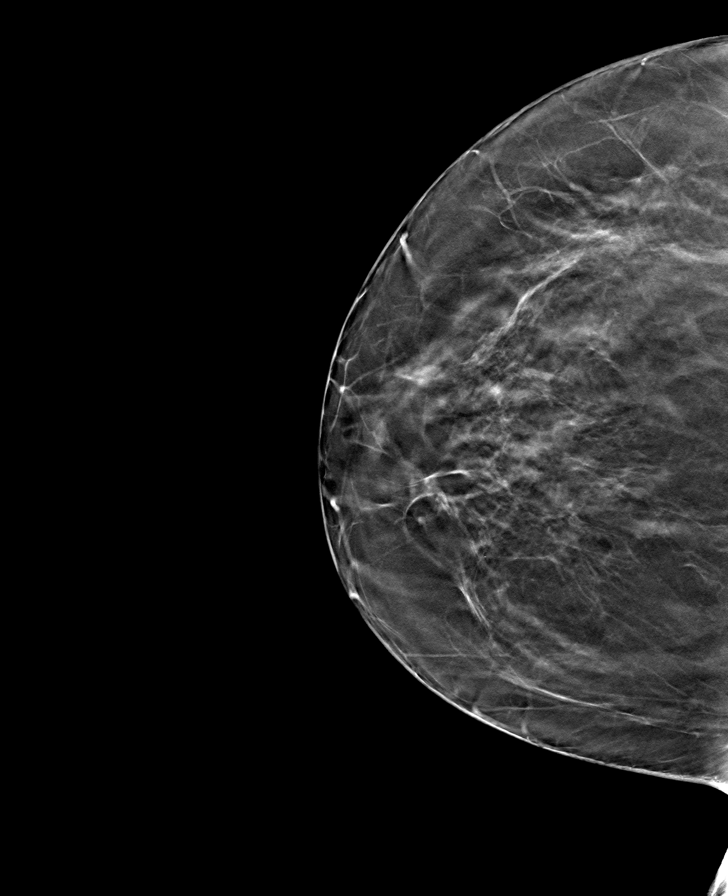

[L CC tomo · tomo slice 35/70.0]
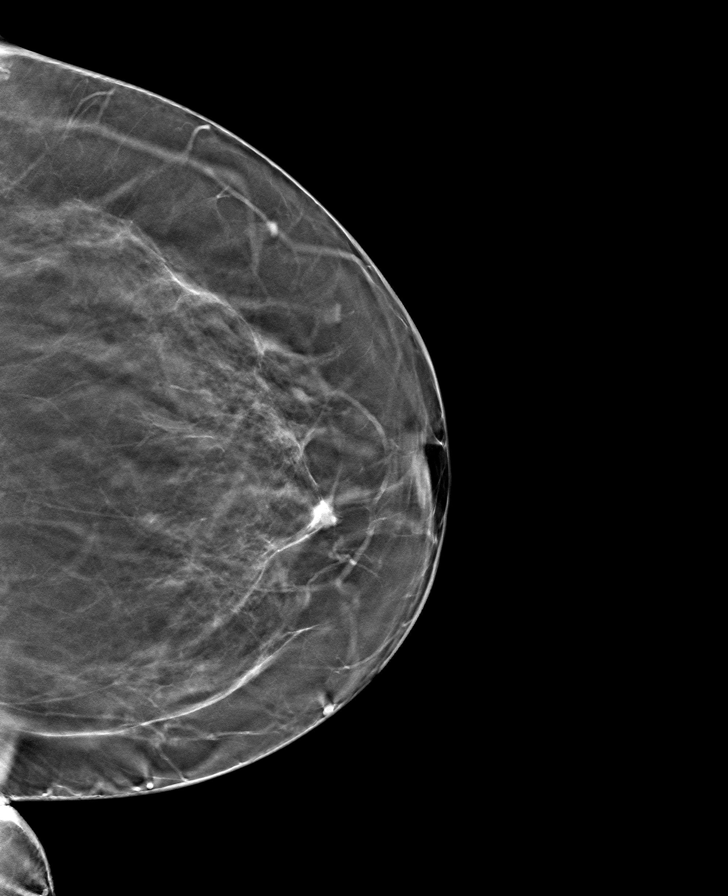

[8 of 24 positions shown; findings below may reference images not displayed]

ACR Breast Density Category b: There are scattered areas of
fibroglandular density.
FINDINGS: There are no findings suspicious for malignancy. Images were
processed with CAD.
IMPRESSION: No mammographic evidence of malignancy. A result letter of this
screening mammogram will be mailed directly to the patient.

RECOMMENDATION:
Screening mammogram in one year. (Code:[id number])

BI-RADS CATEGORY  1: Negative.

## 2019-09-23 ENCOUNTER — Other Ambulatory Visit: Payer: Self-pay

## 2019-09-23 ENCOUNTER — Other Ambulatory Visit: Payer: Self-pay | Admitting: Family Medicine

## 2019-09-23 DIAGNOSIS — Z1231 Encounter for screening mammogram for malignant neoplasm of breast: Secondary | ICD-10-CM

## 2019-10-30 ENCOUNTER — Ambulatory Visit
Admission: RE | Admit: 2019-10-30 | Discharge: 2019-10-30 | Disposition: A | Discharge status: Home / Self Care | Payer: Medicare Other | Source: Ambulatory Visit | Attending: Family Medicine | Admitting: Family Medicine

## 2019-10-30 ENCOUNTER — Other Ambulatory Visit: Payer: Self-pay

## 2019-10-30 DIAGNOSIS — Z1231 Encounter for screening mammogram for malignant neoplasm of breast: Secondary | ICD-10-CM

## 2020-09-17 DIAGNOSIS — M8580 Other specified disorders of bone density and structure, unspecified site: Secondary | ICD-10-CM | POA: Diagnosis not present

## 2020-09-17 DIAGNOSIS — N3281 Overactive bladder: Secondary | ICD-10-CM | POA: Diagnosis not present

## 2020-09-17 DIAGNOSIS — G4730 Sleep apnea, unspecified: Secondary | ICD-10-CM | POA: Diagnosis not present

## 2020-09-17 DIAGNOSIS — I10 Essential (primary) hypertension: Secondary | ICD-10-CM | POA: Diagnosis not present

## 2020-09-17 DIAGNOSIS — E7800 Pure hypercholesterolemia, unspecified: Secondary | ICD-10-CM | POA: Diagnosis not present

## 2020-09-17 DIAGNOSIS — K219 Gastro-esophageal reflux disease without esophagitis: Secondary | ICD-10-CM | POA: Diagnosis not present

## 2020-09-28 ENCOUNTER — Other Ambulatory Visit: Payer: Self-pay | Admitting: Family Medicine

## 2020-09-28 DIAGNOSIS — Z1231 Encounter for screening mammogram for malignant neoplasm of breast: Secondary | ICD-10-CM

## 2020-10-14 DIAGNOSIS — H2513 Age-related nuclear cataract, bilateral: Secondary | ICD-10-CM | POA: Diagnosis not present

## 2020-11-12 ENCOUNTER — Other Ambulatory Visit: Payer: Self-pay

## 2020-11-12 ENCOUNTER — Ambulatory Visit
Admission: RE | Admit: 2020-11-12 | Discharge: 2020-11-12 | Disposition: A | Discharge status: Home / Self Care | Payer: Medicare Other | Source: Ambulatory Visit | Attending: Family Medicine | Admitting: Family Medicine

## 2020-11-12 DIAGNOSIS — Z1231 Encounter for screening mammogram for malignant neoplasm of breast: Secondary | ICD-10-CM | POA: Diagnosis not present

## 2020-12-18 DIAGNOSIS — G4733 Obstructive sleep apnea (adult) (pediatric): Secondary | ICD-10-CM | POA: Diagnosis not present

## 2020-12-29 DIAGNOSIS — R1314 Dysphagia, pharyngoesophageal phase: Secondary | ICD-10-CM | POA: Diagnosis not present

## 2020-12-29 DIAGNOSIS — K219 Gastro-esophageal reflux disease without esophagitis: Secondary | ICD-10-CM | POA: Diagnosis not present

## 2021-03-19 DIAGNOSIS — Z0000 Encounter for general adult medical examination without abnormal findings: Secondary | ICD-10-CM | POA: Diagnosis not present

## 2021-03-19 DIAGNOSIS — K219 Gastro-esophageal reflux disease without esophagitis: Secondary | ICD-10-CM | POA: Diagnosis not present

## 2021-03-19 DIAGNOSIS — I10 Essential (primary) hypertension: Secondary | ICD-10-CM | POA: Diagnosis not present

## 2021-03-19 DIAGNOSIS — M8580 Other specified disorders of bone density and structure, unspecified site: Secondary | ICD-10-CM | POA: Diagnosis not present

## 2021-03-19 DIAGNOSIS — E7800 Pure hypercholesterolemia, unspecified: Secondary | ICD-10-CM | POA: Diagnosis not present

## 2021-03-19 DIAGNOSIS — N3281 Overactive bladder: Secondary | ICD-10-CM | POA: Diagnosis not present

## 2021-03-19 DIAGNOSIS — G4730 Sleep apnea, unspecified: Secondary | ICD-10-CM | POA: Diagnosis not present

## 2021-03-22 ENCOUNTER — Other Ambulatory Visit: Payer: Self-pay | Admitting: Family Medicine

## 2021-03-22 DIAGNOSIS — M8580 Other specified disorders of bone density and structure, unspecified site: Secondary | ICD-10-CM

## 2021-03-22 DIAGNOSIS — E2839 Other primary ovarian failure: Secondary | ICD-10-CM

## 2021-06-11 DIAGNOSIS — G4733 Obstructive sleep apnea (adult) (pediatric): Secondary | ICD-10-CM | POA: Diagnosis not present

## 2021-07-13 DIAGNOSIS — H2513 Age-related nuclear cataract, bilateral: Secondary | ICD-10-CM | POA: Diagnosis not present

## 2021-08-11 DIAGNOSIS — Z23 Encounter for immunization: Secondary | ICD-10-CM | POA: Diagnosis not present

## 2021-09-14 ENCOUNTER — Other Ambulatory Visit: Payer: Self-pay

## 2021-09-14 ENCOUNTER — Ambulatory Visit
Admission: RE | Admit: 2021-09-14 | Discharge: 2021-09-14 | Disposition: A | Discharge status: Home / Self Care | Payer: Medicare Other | Source: Ambulatory Visit | Attending: Family Medicine | Admitting: Family Medicine

## 2021-09-14 DIAGNOSIS — M8580 Other specified disorders of bone density and structure, unspecified site: Secondary | ICD-10-CM

## 2021-09-14 DIAGNOSIS — E2839 Other primary ovarian failure: Secondary | ICD-10-CM

## 2021-09-14 DIAGNOSIS — Z780 Asymptomatic menopausal state: Secondary | ICD-10-CM | POA: Diagnosis not present

## 2021-09-14 DIAGNOSIS — M85851 Other specified disorders of bone density and structure, right thigh: Secondary | ICD-10-CM | POA: Diagnosis not present

## 2021-09-20 DIAGNOSIS — E7800 Pure hypercholesterolemia, unspecified: Secondary | ICD-10-CM | POA: Diagnosis not present

## 2021-09-20 DIAGNOSIS — G4730 Sleep apnea, unspecified: Secondary | ICD-10-CM | POA: Diagnosis not present

## 2021-09-20 DIAGNOSIS — N3281 Overactive bladder: Secondary | ICD-10-CM | POA: Diagnosis not present

## 2021-09-20 DIAGNOSIS — K219 Gastro-esophageal reflux disease without esophagitis: Secondary | ICD-10-CM | POA: Diagnosis not present

## 2021-09-20 DIAGNOSIS — I10 Essential (primary) hypertension: Secondary | ICD-10-CM | POA: Diagnosis not present

## 2021-09-20 DIAGNOSIS — M8580 Other specified disorders of bone density and structure, unspecified site: Secondary | ICD-10-CM | POA: Diagnosis not present

## 2021-09-20 DIAGNOSIS — K429 Umbilical hernia without obstruction or gangrene: Secondary | ICD-10-CM | POA: Diagnosis not present

## 2021-10-21 DIAGNOSIS — K429 Umbilical hernia without obstruction or gangrene: Secondary | ICD-10-CM | POA: Diagnosis not present

## 2021-11-16 NOTE — Progress Notes (Signed)
Sent message, via epic in basket, requesting orders in epic from surgeon.  

## 2021-11-18 ENCOUNTER — Ambulatory Visit: Payer: Self-pay | Admitting: General Surgery

## 2021-11-24 NOTE — Patient Instructions (Addendum)
DUE TO COVID-19 ONLY ONE VISITOR  (aged 79 and older)  IS ALLOWED TO COME WITH YOU AND STAY IN THE WAITING ROOM ONLY DURING PRE OP AND PROCEDURE.   ?**NO VISITORS ARE ALLOWED IN THE SHORT STAY AREA OR RECOVERY ROOM!!** ? ?IF YOU WILL BE ADMITTED INTO THE HOSPITAL YOU ARE ALLOWED ONLY TWO SUPPORT PEOPLE DURING VISITATION HOURS ONLY (7 AM -8PM)   ?The support person(s) must pass our screening, gel in and out, and wear a mask at all times, including in the patient?s room. ?Patients must also wear a mask when staff or their support person are in the room. ?Visitors GUEST BADGE MUST BE WORN VISIBLY  ?One adult visitor may remain with you overnight and MUST be in the room by 8 P.M. ?  ? ? Your procedure is scheduled on: 12/09/21 ? ? Report to [facility] Main Entrance ? ?  Report to Short stay at : 5:15 AM ? ? Call this number if you have problems the morning of surgery [phone] ? ? Do not eat food :After Midnight. ? ? After Midnight you may have the following liquids until : 4:30 AM  DAY OF SURGERY ? ?Water ?Black Coffee (sugar ok, NO MILK/CREAM OR CREAMERS)  ?Tea (sugar ok, NO MILK/CREAM OR CREAMERS) regular and decaf                             ?Plain Jell-O (NO RED)                                           ?Fruit ices (not with fruit pulp, NO RED)                                     ?Popsicles (NO RED)                                                                  ?Juice: apple, WHITE grape, WHITE cranberry ?Sports drinks like Gatorade (NO RED) ?Clear broth(vegetable,chicken,beef) ?  ?Oral Hygiene is also important to reduce your risk of infection.                                    ?Remember - BRUSH YOUR TEETH THE MORNING OF SURGERY WITH YOUR REGULAR TOOTHPASTE ? ? Do NOT smoke after Midnight ? ? Take these medicines the morning of surgery with A SIP OF WATER: carvedilol,amlodipine,pantoprazole.Tylenol,diazepam,vesicare as needed. ? ?DO NOT TAKE ANY ORAL DIABETIC MEDICATIONS DAY OF YOUR  SURGERY ? ?Bring CPAP mask and tubing day of surgery. ?                  ?           You may not have any metal on your body including hair pins, jewelry, and body piercing ? ?           Do not wear make-up, lotions, powders, perfumes/cologne, or deodorant ? ?Do not  wear nail polish including gel and S&S, artificial/acrylic nails, or any other type of covering on natural nails including finger and toenails. If you have artificial nails, gel coating, etc. that needs to be removed by a nail salon please have this removed prior to surgery or surgery may need to be canceled/ delayed if the surgeon/ anesthesia feels like they are unable to be safely monitored.  ? ?Do not shave  48 hours prior to surgery. ? ? Do not bring valuables to the hospital. [facility] NOT ?            RESPONSIBLE   FOR VALUABLES. ? ? Contacts, dentures or bridgework may not be worn into surgery. ? ? Bring small overnight bag day of surgery. ?  ? Patients discharged on the day of surgery will not be allowed to drive home.  Someone NEEDS to stay with you for the first 24 hours after anesthesia. ? ? Special Instructions: Bring a copy of your healthcare power of attorney and living will documents         the day of surgery if you haven't scanned them before. ? ?            Please read over the following fact sheets you were given: IF [place] [phone] ? ?   [facility] - Preparing for Surgery ?Before surgery, you can play an important role.  Because skin is not sterile, your skin needs to be as free of germs as possible.  You can reduce the number of germs on your skin by washing with CHG (chlorahexidine gluconate) soap before surgery.  CHG is an antiseptic cleaner which kills germs and bonds with the skin to continue killing germs even after washing. ?Please DO NOT use if you have an allergy to CHG or antibacterial soaps.  If your skin becomes reddened/irritated stop using the CHG and  inform your nurse when you arrive at Short Stay. ?Do not shave (including legs and underarms) for at least 48 hours prior to the first CHG shower.  You may shave your face/neck. ?Please follow these instructions carefully: ? 1.  Shower with CHG Soap the night before surgery and the  morning of Surgery. ? 2.  If you choose to wash your hair, wash your hair first as usual with your  normal  shampoo. ? 3.  After you shampoo, rinse your hair and body thoroughly to remove the  shampoo.                           4.  Use CHG as you would any other liquid soap.  You can apply chg directly  to the skin and wash  ?                     Gently with a scrungie or clean washcloth. ? 5.  Apply the CHG Soap to your body ONLY FROM THE NECK DOWN.   Do not use on face/ open      ?                     Wound or open sores. Avoid contact with eyes, ears mouth and genitals (private parts).  ?                     Production manager,  Genitals (private parts) with your normal soap. ?  6.  Wash thoroughly, paying special attention to the area where your surgery  will be performed. ? 7.  Thoroughly rinse your body with warm water from the neck down. ? 8.  DO NOT shower/wash with your normal soap after using and rinsing off  the CHG Soap. ?               9.  Pat yourself dry with a clean towel. ?           10.  Wear clean pajamas. ?           11.  Place clean sheets on your bed the night of your first shower and do not  sleep with pets. ?Day of Surgery : ?Do not apply any lotions/deodorants the morning of surgery.  Please wear clean clothes to the hospital/surgery center. ? ?FAILURE TO FOLLOW THESE INSTRUCTIONS MAY RESULT IN THE CANCELLATION OF YOUR SURGERY ?PATIENT SIGNATURE_________________________________ ? ?NURSE SIGNATURE__________________________________ ? ?________________________________________________________________________  ?

## 2021-11-25 ENCOUNTER — Encounter (HOSPITAL_COMMUNITY)
Admission: RE | Admit: 2021-11-25 | Discharge: 2021-11-25 | Disposition: A | Discharge status: Home / Self Care | Payer: Medicare Other | Source: Ambulatory Visit | Attending: General Surgery | Admitting: General Surgery

## 2021-11-25 ENCOUNTER — Encounter (HOSPITAL_COMMUNITY): Payer: Self-pay

## 2021-11-25 ENCOUNTER — Other Ambulatory Visit: Payer: Self-pay

## 2021-11-25 VITALS — BP 184/93 | HR 70 | Temp 97.5°F | Ht 64.0 in | Wt 208.0 lb

## 2021-11-25 DIAGNOSIS — I2510 Atherosclerotic heart disease of native coronary artery without angina pectoris: Secondary | ICD-10-CM | POA: Diagnosis not present

## 2021-11-25 DIAGNOSIS — Z01818 Encounter for other preprocedural examination: Secondary | ICD-10-CM | POA: Diagnosis not present

## 2021-11-25 HISTORY — DX: Unspecified osteoarthritis, unspecified site: M19.90

## 2021-11-25 HISTORY — DX: Malignant (primary) neoplasm, unspecified: C80.1

## 2021-11-25 LAB — CBC
HCT: 39.9 % (ref 36.0–46.0)
Hemoglobin: 13.5 g/dL (ref 12.0–15.0)
MCH: 31 pg (ref 26.0–34.0)
MCHC: 33.8 g/dL (ref 30.0–36.0)
MCV: 91.7 fL (ref 80.0–100.0)
Platelets: 337 10*3/uL (ref 150–400)
RBC: 4.35 MIL/uL (ref 3.87–5.11)
RDW: 12.9 % (ref 11.5–15.5)
WBC: 7 10*3/uL (ref 4.0–10.5)
nRBC: 0 % (ref 0.0–0.2)

## 2021-11-25 LAB — BASIC METABOLIC PANEL
Anion gap: 7 (ref 5–15)
BUN: 8 mg/dL (ref 8–23)
CO2: 27 mmol/L (ref 22–32)
Calcium: 9.6 mg/dL (ref 8.9–10.3)
Chloride: 99 mmol/L (ref 98–111)
Creatinine, Ser: 0.53 mg/dL (ref 0.44–1.00)
GFR, Estimated: >60 mL/min (ref >60)
Glucose, Bld: 104 mg/dL — ABNORMAL HIGH (ref 70–99)
Potassium: 3.6 mmol/L (ref 3.5–5.1)
Sodium: 133 mmol/L — ABNORMAL LOW (ref 135–145)

## 2021-11-25 NOTE — Progress Notes (Addendum)
For Short Stay: ?[facility] appointment date: ?Date of COVID positive in last 90 days: ?COVID Vaccine: Phyzer x 3 ?Bowel Prep reminder: ? ? ?For Anesthesia: ?PCP - Dr. Donnie Coffin ?Cardiologist -  ? ?Chest x-ray -  ?EKG -  ?Stress Test -  ?ECHO -  ?Cardiac Cath -  ?Pacemaker/ICD device last checked: ?Pacemaker orders received: ?Device Rep notified: ? ?Spinal Cord Stimulator: ? ?Sleep Study -  ?CPAP -  ? ?Fasting Blood Sugar -  ?Checks Blood Sugar _____ times a day ?Date and result of last Hgb A1c- ? ?Blood Thinner Instructions: ?Aspirin Instructions: ?Last Dose: ? ?Activity level: Can go up a flight of stairs and activities of daily living without stopping and without chest pain and/or shortness of breath ?  Able to exercise without chest pain and/or shortness of breath ?  Unable to go up a flight of stairs without chest pain and/or shortness of breath ?   ? ?Anesthesia review: Hx: HTN,OSA(CPAP) ? ?Patient denies shortness of breath, fever, cough and chest pain at PAT appointment ? ? ?Patient verbalized understanding of instructions that were given to them at the PAT appointment. Patient was also instructed that they will need to review over the PAT instructions again at home before surgery.  ?

## 2021-12-08 DIAGNOSIS — G4733 Obstructive sleep apnea (adult) (pediatric): Secondary | ICD-10-CM | POA: Diagnosis not present

## 2021-12-09 ENCOUNTER — Encounter (HOSPITAL_COMMUNITY): Payer: Self-pay | Admitting: General Surgery

## 2021-12-09 ENCOUNTER — Encounter (HOSPITAL_COMMUNITY)
Admission: RE | Disposition: A | Discharge status: Home / Self Care | Payer: Self-pay | Source: Home / Self Care | Attending: General Surgery

## 2021-12-09 ENCOUNTER — Other Ambulatory Visit: Payer: Self-pay

## 2021-12-09 ENCOUNTER — Ambulatory Visit (HOSPITAL_BASED_OUTPATIENT_CLINIC_OR_DEPARTMENT_OTHER): Payer: Medicare Other | Admitting: Certified Registered"

## 2021-12-09 ENCOUNTER — Ambulatory Visit (HOSPITAL_COMMUNITY)
Admission: RE | Admit: 2021-12-09 | Discharge: 2021-12-09 | Disposition: A | Discharge status: Home / Self Care | Payer: Medicare Other | Attending: General Surgery | Admitting: General Surgery

## 2021-12-09 ENCOUNTER — Ambulatory Visit (HOSPITAL_COMMUNITY): Payer: Medicare Other | Admitting: Certified Registered"

## 2021-12-09 DIAGNOSIS — I10 Essential (primary) hypertension: Secondary | ICD-10-CM | POA: Insufficient documentation

## 2021-12-09 DIAGNOSIS — E669 Obesity, unspecified: Secondary | ICD-10-CM | POA: Diagnosis not present

## 2021-12-09 DIAGNOSIS — K429 Umbilical hernia without obstruction or gangrene: Secondary | ICD-10-CM | POA: Insufficient documentation

## 2021-12-09 DIAGNOSIS — Z6835 Body mass index (BMI) 35.0-35.9, adult: Secondary | ICD-10-CM | POA: Insufficient documentation

## 2021-12-09 HISTORY — PX: UMBILICAL HERNIA REPAIR: SHX196

## 2021-12-09 SURGERY — REPAIR, HERNIA, UMBILICAL, ADULT
Anesthesia: General

## 2021-12-09 MED ORDER — ROCURONIUM BROMIDE 10 MG/ML (PF) SYRINGE
PREFILLED_SYRINGE | INTRAVENOUS | Status: DC | PRN
  Administered 2021-12-09: 100 mg via INTRAVENOUS

## 2021-12-09 MED ORDER — 0.9 % SODIUM CHLORIDE (POUR BTL) OPTIME
TOPICAL | Status: DC | PRN
  Administered 2021-12-09: 1000 mL

## 2021-12-09 MED ORDER — FENTANYL CITRATE (PF) 100 MCG/2ML IJ SOLN
INTRAMUSCULAR | Status: AC
  Filled 2021-12-09: qty 2

## 2021-12-09 MED ORDER — BUPIVACAINE-EPINEPHRINE (PF) 0.25% -1:200000 IJ SOLN
INTRAMUSCULAR | Status: AC
  Filled 2021-12-09: qty 30

## 2021-12-09 MED ORDER — EPHEDRINE SULFATE-NACL 50-0.9 MG/10ML-% IV SOSY
PREFILLED_SYRINGE | INTRAVENOUS | Status: DC | PRN
  Administered 2021-12-09: 10 mg via INTRAVENOUS

## 2021-12-09 MED ORDER — OXYCODONE HCL 5 MG PO TABS: 5.0000 mg | ORAL_TABLET | Freq: Once | ORAL | Status: DC | PRN

## 2021-12-09 MED ORDER — CHLORHEXIDINE GLUCONATE CLOTH 2 % EX PADS: 6.0000 | MEDICATED_PAD | Freq: Once | CUTANEOUS | Status: DC

## 2021-12-09 MED ORDER — PROPOFOL 10 MG/ML IV BOLUS
INTRAVENOUS | Status: AC
  Filled 2021-12-09: qty 20

## 2021-12-09 MED ORDER — ROCURONIUM BROMIDE 10 MG/ML (PF) SYRINGE
PREFILLED_SYRINGE | INTRAVENOUS | Status: AC
  Filled 2021-12-09: qty 10

## 2021-12-09 MED ORDER — HYDROMORPHONE HCL 1 MG/ML IJ SOLN
INTRAMUSCULAR | Status: AC
  Filled 2021-12-09: qty 1

## 2021-12-09 MED ORDER — ONDANSETRON HCL 4 MG/2ML IJ SOLN
INTRAMUSCULAR | Status: DC | PRN
  Administered 2021-12-09: 4 mg via INTRAVENOUS

## 2021-12-09 MED ORDER — FENTANYL CITRATE (PF) 100 MCG/2ML IJ SOLN
INTRAMUSCULAR | Status: DC | PRN
  Administered 2021-12-09: 100 ug via INTRAVENOUS

## 2021-12-09 MED ORDER — AMISULPRIDE (ANTIEMETIC) 5 MG/2ML IV SOLN
INTRAVENOUS | Status: AC
  Filled 2021-12-09: qty 4

## 2021-12-09 MED ORDER — ACETAMINOPHEN 500 MG PO TABS
1000.0000 mg | ORAL_TABLET | ORAL | Status: AC
  Administered 2021-12-09: 1000 mg via ORAL
  Filled 2021-12-09: qty 2

## 2021-12-09 MED ORDER — ORAL CARE MOUTH RINSE
15.0000 mL | Freq: Once | OROMUCOSAL | Status: AC
Start: 2021-12-09 — End: 2021-12-09

## 2021-12-09 MED ORDER — AMISULPRIDE (ANTIEMETIC) 5 MG/2ML IV SOLN
10.0000 mg | Freq: Once | INTRAVENOUS | Status: AC | PRN
  Administered 2021-12-09: 10 mg via INTRAVENOUS

## 2021-12-09 MED ORDER — PHENYLEPHRINE HCL (PRESSORS) 10 MG/ML IV SOLN
INTRAVENOUS | Status: AC
  Filled 2021-12-09: qty 1

## 2021-12-09 MED ORDER — LIDOCAINE 2% (20 MG/ML) 5 ML SYRINGE
INTRAMUSCULAR | Status: DC | PRN
  Administered 2021-12-09: 100 mg via INTRAVENOUS

## 2021-12-09 MED ORDER — HYDROMORPHONE HCL 1 MG/ML IJ SOLN
0.2500 mg | INTRAMUSCULAR | Status: DC | PRN
  Administered 2021-12-09 (×2): 0.5 mg via INTRAVENOUS

## 2021-12-09 MED ORDER — PROPOFOL 10 MG/ML IV BOLUS
INTRAVENOUS | Status: DC | PRN
  Administered 2021-12-09: 150 mg via INTRAVENOUS

## 2021-12-09 MED ORDER — ONDANSETRON HCL 4 MG/2ML IJ SOLN
INTRAMUSCULAR | Status: AC
  Filled 2021-12-09: qty 2

## 2021-12-09 MED ORDER — BUPIVACAINE-EPINEPHRINE 0.25% -1:200000 IJ SOLN
INTRAMUSCULAR | Status: DC | PRN
  Administered 2021-12-09: 30 mL

## 2021-12-09 MED ORDER — SUCCINYLCHOLINE CHLORIDE 200 MG/10ML IV SOSY
PREFILLED_SYRINGE | INTRAVENOUS | Status: AC
  Filled 2021-12-09: qty 10

## 2021-12-09 MED ORDER — DEXAMETHASONE SODIUM PHOSPHATE 10 MG/ML IJ SOLN
INTRAMUSCULAR | Status: DC | PRN
Start: 2021-12-09 — End: 2021-12-09
  Administered 2021-12-09: 8 mg via INTRAVENOUS

## 2021-12-09 MED ORDER — OXYCODONE HCL 5 MG/5ML PO SOLN: 5.0000 mg | Freq: Once | ORAL | Status: DC | PRN

## 2021-12-09 MED ORDER — HYDRALAZINE HCL 20 MG/ML IJ SOLN
INTRAMUSCULAR | Status: DC | PRN
  Administered 2021-12-09 (×2): 5 mg via INTRAVENOUS

## 2021-12-09 MED ORDER — LACTATED RINGERS IV SOLN: INTRAVENOUS | Status: DC

## 2021-12-09 MED ORDER — DEXAMETHASONE SODIUM PHOSPHATE 10 MG/ML IJ SOLN
INTRAMUSCULAR | Status: AC
  Filled 2021-12-09: qty 1

## 2021-12-09 MED ORDER — OXYCODONE HCL 5 MG PO TABS: 5.0000 mg | ORAL_TABLET | Freq: Four times a day (QID) | ORAL | 0 refills | Status: DC | PRN

## 2021-12-09 MED ORDER — SUGAMMADEX SODIUM 200 MG/2ML IV SOLN
INTRAVENOUS | Status: DC | PRN
  Administered 2021-12-09: 400 mg via INTRAVENOUS

## 2021-12-09 MED ORDER — CHLORHEXIDINE GLUCONATE 0.12 % MT SOLN
15.0000 mL | Freq: Once | OROMUCOSAL | Status: AC
  Administered 2021-12-09: 15 mL via OROMUCOSAL

## 2021-12-09 MED ORDER — PROMETHAZINE HCL 25 MG/ML IJ SOLN: 6.2500 mg | INTRAMUSCULAR | Status: DC | PRN

## 2021-12-09 MED ORDER — CEFAZOLIN SODIUM-DEXTROSE 2-4 GM/100ML-% IV SOLN
2.0000 g | INTRAVENOUS | Status: AC
  Administered 2021-12-09: 2 g via INTRAVENOUS
  Filled 2021-12-09: qty 100

## 2021-12-09 SURGICAL SUPPLY — 34 items
ADH SKN CLS APL DERMABOND .7 (GAUZE/BANDAGES/DRESSINGS) ×1
APL PRP STRL LF DISP 70% ISPRP (MISCELLANEOUS) ×1
BAG COUNTER SPONGE SURGICOUNT (BAG) IMPLANT
BAG SPNG CNTER NS LX DISP (BAG)
BLADE SURG SZ10 CARB STEEL (BLADE) ×1 IMPLANT
CHLORAPREP W/TINT 26 (MISCELLANEOUS) ×2 IMPLANT
COVER SURGICAL LIGHT HANDLE (MISCELLANEOUS) ×2 IMPLANT
DERMABOND ADVANCED (GAUZE/BANDAGES/DRESSINGS) ×1
DERMABOND ADVANCED .7 DNX12 (GAUZE/BANDAGES/DRESSINGS) ×1 IMPLANT
DRAPE LAPAROSCOPIC ABDOMINAL (DRAPES) ×2 IMPLANT
ELECT REM PT RETURN 15FT ADLT (MISCELLANEOUS) ×2 IMPLANT
GLOVE BIOGEL PI IND STRL 7.0 (GLOVE) ×1 IMPLANT
GLOVE BIOGEL PI INDICATOR 7.0 (GLOVE) ×1
GLOVE SURG SS PI 7.0 STRL IVOR (GLOVE) ×2 IMPLANT
GOWN STRL REUS W/ TWL LRG LVL3 (GOWN DISPOSABLE) ×1 IMPLANT
GOWN STRL REUS W/ TWL XL LVL3 (GOWN DISPOSABLE) IMPLANT
GOWN STRL REUS W/TWL LRG LVL3 (GOWN DISPOSABLE) ×2
GOWN STRL REUS W/TWL XL LVL3 (GOWN DISPOSABLE)
KIT BASIN OR (CUSTOM PROCEDURE TRAY) ×2 IMPLANT
KIT TURNOVER KIT A (KITS) IMPLANT
MESH BARD SOFT 6X6IN (Mesh General) ×1 IMPLANT
NEEDLE HYPO 22GX1.5 SAFETY (NEEDLE) ×2 IMPLANT
PACK BASIC VI WITH GOWN DISP (CUSTOM PROCEDURE TRAY) ×2 IMPLANT
PENCIL SMOKE EVACUATOR (MISCELLANEOUS) ×1 IMPLANT
SPIKE FLUID TRANSFER (MISCELLANEOUS) ×2 IMPLANT
SPONGE T-LAP 4X18 ~~LOC~~+RFID (SPONGE) ×2 IMPLANT
SUT MNCRL AB 4-0 PS2 18 (SUTURE) ×2 IMPLANT
SUT NOVA NAB GS-21 0 18 T12 DT (SUTURE) ×1 IMPLANT
SUT PDS AB 0 CT1 36 (SUTURE) ×1 IMPLANT
SUT VIC AB 3-0 SH 27 (SUTURE) ×2
SUT VIC AB 3-0 SH 27X BRD (SUTURE) ×1 IMPLANT
SYR CONTROL 10ML LL (SYRINGE) ×2 IMPLANT
TOWEL OR 17X26 10 PK STRL BLUE (TOWEL DISPOSABLE) ×2 IMPLANT
TOWEL OR NON WOVEN STRL DISP B (DISPOSABLE) ×2 IMPLANT

## 2021-12-09 NOTE — Anesthesia Procedure Notes (Signed)
Procedure Name: Intubation ?Date/Time: 12/09/2021 7:38 AM ?Performed by: Cleda Daub, CRNA ?Pre-anesthesia Checklist: Patient identified, Emergency Drugs available, Suction available and Patient being monitored ?Patient Re-evaluated:Patient Re-evaluated prior to induction ?Oxygen Delivery Method: Circle system utilized ?Preoxygenation: Pre-oxygenation with 100% oxygen ?Induction Type: IV induction ?Ventilation: Mask ventilation without difficulty ?Laryngoscope Size: Mac and 3 ?Grade View: Grade I ?Tube type: Oral ?Tube size: 7.0 mm ?Number of attempts: 1 ?Airway Equipment and Method: Stylet and Oral airway ?Placement Confirmation: ETT inserted through vocal cords under direct vision, positive ETCO2 and breath sounds checked- equal and bilateral ?Secured at: 20 cm ?Tube secured with: Tape ?Dental Injury: Teeth and Oropharynx as per pre-operative assessment  ? ? ? ? ?

## 2021-12-09 NOTE — Op Note (Signed)
PATIENT:  Kiara Zamora  81 y.o. female ? ?PRE-OPERATIVE DIAGNOSIS:  UMBILICAL HERNIA ? ?POST-OPERATIVE DIAGNOSIS:  UMBILICAL HERNIA ? ?PROCEDURE:  Procedure(s): ?OPEN UMBILICAL HERNIA WITH MESH ? ? ?SURGEON:  Surgeon(s): ?Tagg Eustice, Arta Bruce, MD ? ?ASSISTANT: none  ? ?ANESTHESIA:   local and general ? ?Indications for procedure: [name] is a 81 y.o. year old female with symptoms of abdominal pain and bulge in the periumbilical space. ? ?Description of procedure: The patient was brought into the operative suite. Anesthesia was administered with General endotracheal anesthesia. WHO checklist was applied. The patient was then placed in supine. The area was prepped and draped in the usual sterile fashion. ? ?The previous midline incision was incised. Cautery and blunt dissection was used to dissect down to the fascia. The hernia sac was dissected free from surrounding tissues in 360 degrees. The umbilical skin was dissected free of the hernia sac with cautery. The hernia sac was reduced and contained no visceral structures. The preperitoneal space was dissected free keeping the sac intact The hernia defect was 4 cm in diameter. The hernia sac was removed. Due to the size of the hernia, a 12 cm Bard mesh was inserted into the preperitoneal space. Care was taken to lay the mesh flat. The mesh was secured in 4 directions with 0 Novafil. The fascial defect was then primarily closed with interrupted 0 PDS sutures. The umbilical skin was sutured to the fascia with a 3-0 vicryl. The deep dermal space was closed with a 3-0 vicryl. Marcaine with Epinephrine was injected into the muscle layer and around the fascia. The skin was closed with a 4-0 monocryl subcuticular suture. Dermabond was put in place for dressing. The patient awoke from anesthesia and was brought to pacu in stable condition. All counts were correct. ? ?Findings: 4 cm umbilical defect ? ?Specimen: none ? ?Blood loss: 10 ml ? ?Local anesthesia: 30  ml Marcaine with Epinephrine ? ?Complications: none ? ?PLAN OF CARE: Discharge to home after PACU ? ?PATIENT DISPOSITION:  PACU - hemodynamically stable. ? ?Gurney Maxin, M.D. ?General, Bariatric, & Minimally Invasive Surgery ?[facility], [place] ? ?12/09/2021 ?8:36 AM ? ?

## 2021-12-09 NOTE — Anesthesia Preprocedure Evaluation (Signed)
Anesthesia Evaluation  ?Patient identified by MRN, date of birth, ID band ?Patient awake ? ? ? ?Reviewed: ?Allergy & Precautions, NPO status , Patient's Chart, lab work & pertinent test results ? ?Airway ?Mallampati: II ? ?TM Distance: >3 FB ?Neck ROM: Full ? ? ? Dental ?no notable dental hx. ? ?  ?Pulmonary ?sleep apnea ,  ?  ?Pulmonary exam normal ?breath sounds clear to auscultation ? ? ? ? ? ? Cardiovascular ?hypertension, Pt. on medications ?negative cardio ROS ?Normal cardiovascular exam ?Rhythm:Regular Rate:Normal ? ? ?  ?Neuro/Psych ?negative neurological ROS ? negative psych ROS  ? GI/Hepatic ?negative GI ROS, Neg liver ROS,   ?Endo/Other  ?negative endocrine ROS ? Renal/GU ?negative Renal ROS  ?negative genitourinary ?  ?Musculoskeletal ? ?(+) Arthritis , Osteoarthritis,   ? Abdominal ?(+) + obese,   ?Peds ?negative pediatric ROS ?(+)  Hematology ?negative hematology ROS ?(+)   ?Anesthesia Other Findings ? ? Reproductive/Obstetrics ?negative OB ROS ? ?  ? ? ? ? ? ? ? ? ? ? ? ? ? ?  ?  ? ? ? ? ? ? ? ? ?Anesthesia Physical ?Anesthesia Plan ? ?ASA: 2 ? ?Anesthesia Plan: General  ? ?Post-op Pain Management:   ? ?Induction: Intravenous ? ?PONV Risk Score and Plan: 3 and Ondansetron, Dexamethasone, Midazolam and Treatment may vary due to age or medical condition ? ?Airway Management Planned: Oral ETT ? ?Additional Equipment:  ? ?Intra-op Plan:  ? ?Post-operative Plan: Extubation in OR ? ?Informed Consent: I have reviewed the patients History and Physical, chart, labs and discussed the procedure including the risks, benefits and alternatives for the proposed anesthesia with the patient or authorized representative who has indicated his/her understanding and acceptance.  ? ? ? ?Dental advisory given ? ?Plan Discussed with: CRNA ? ?Anesthesia Plan Comments:   ? ? ? ? ? ? ?Anesthesia Quick Evaluation ? ?

## 2021-12-09 NOTE — Discharge Instructions (Signed)
[facility] _______[facility]  UMBILICAL OR INGUINAL HERNIA REPAIR: POST OP INSTRUCTIONS  Always review your discharge instruction sheet given to you by the facility where your surgery was performed. IF YOU HAVE DISABILITY OR FAMILY LEAVE FORMS, YOU MUST BRING THEM TO THE OFFICE FOR PROCESSING.   DO NOT GIVE THEM TO YOUR DOCTOR.  1. A  prescription for pain medication may be given to you upon discharge.  Take your pain medication as prescribed, if needed.  If narcotic pain medicine is not needed, then you may take acetaminophen (Tylenol) or ibuprofen (Advil) as needed. 2. Take your usually prescribed medications unless otherwise directed. If you need a refill on your pain medication, please contact your pharmacy.  They will contact our office to request authorization. Prescriptions will not be filled after 5 pm or on week-ends. 3. You should follow a light diet the first 24 hours after arrival home, such as soup and crackers, etc.  Be sure to include lots of fluids daily.  Resume your normal diet the day after surgery. 4.Most patients will experience some swelling and bruising around the umbilicus or in the groin and scrotum.  Ice packs and reclining will help.  Swelling and bruising can take several days to resolve.  6. It is common to experience some constipation if taking pain medication after surgery.  Increasing fluid intake and taking a stool softener (such as Colace) will usually help or prevent this problem from occurring.  A mild laxative (Milk of Magnesia or Miralax) should be taken according to package directions if there are no bowel movements after 48 hours. 7. Unless discharge instructions indicate otherwise, you may remove your bandages 24-48 hours after surgery, and you may shower at that time.  You may have steri-strips (small skin tapes) in place directly over the incision.  These strips should be left on the skin for 7-10 days.  If your surgeon used skin glue on the  incision, you may shower in 24 hours.  The glue will flake off over the next 2-3 weeks.  Any sutures or staples will be removed at the office during your follow-up visit. 8. ACTIVITIES:  You may resume regular (light) daily activities beginning the next day--such as daily self-care, walking, climbing stairs--gradually increasing activities as tolerated.  You may have sexual intercourse when it is comfortable.  Refrain from any heavy lifting or straining until approved by your doctor.  a.You may drive when you are no longer taking prescription pain medication, you can comfortably wear a seatbelt, and you can safely maneuver your car and apply brakes. b.RETURN TO WORK:   _____________________________________________  9.You should see your doctor in the office for a follow-up appointment approximately 2-3 weeks after your surgery.  Make sure that you call for this appointment within a day or two after you arrive home to insure a convenient appointment time. 10.OTHER INSTRUCTIONS: _________________________    _____________________________________  WHEN TO CALL YOUR DOCTOR: Fever over 101.0 Inability to urinate Nausea and/or vomiting Extreme swelling or bruising Continued bleeding from incision. Increased pain, redness, or drainage from the incision  The clinic staff is available to answer your questions during regular business hours.  Please don't hesitate to call and ask to speak to one of the nurses for clinical concerns.  If you have a medical emergency, go to the nearest emergency room or call 911.  A surgeon from [facility] is always on call at the hospital   [address], [address],   [place], [place]  [address] ?  [address] [address], [place], [place]   [address] [phone] ? [phone] ? FAX [phone] Web site: [email or web address]  

## 2021-12-09 NOTE — Transfer of Care (Signed)
Immediate Anesthesia Transfer of Care Note ? ?Patient: Kiara Zamora ? ?Procedure(s) Performed: OPEN UMBILICAL HERNIA WITH MESH ? ?Patient Location: PACU ? ?Anesthesia Type:General ? ?Level of Consciousness: awake, alert , oriented and patient cooperative ? ?Airway & Oxygen Therapy: Patient Spontanous Breathing and Patient connected to face mask oxygen ? ?Post-op Assessment: Report given to RN and Post -op Vital signs reviewed and stable ? ?Post vital signs: Reviewed and stable ? ?Last Vitals:  ?Vitals Value Taken Time  ?BP 178/88 12/09/21 0845  ?Temp 36.6 ?C 12/09/21 0842  ?Pulse 71 12/09/21 0848  ?Resp 18 12/09/21 0848  ?SpO2 94 % 12/09/21 0848  ?Vitals shown include unvalidated device data. ? ?Last Pain:  ?Vitals:  ? 12/09/21 0546  ?TempSrc:   ?PainSc: 0-No pain  ?   ? ?  ? ?Complications: No notable events documented. ?

## 2021-12-09 NOTE — H&P (Signed)
Chief Complaint: Umbilical Hernia ? ? ?History of Present Illness: ?Kiara Zamora is a 81 y.o. female who is seen today as an office consultation at the request of Dr. Alroy Dust for evaluation of Umbilical Hernia ?.  ? ?She noticed a bulge at her umbilical area 1 month ago. I occasionally has some soreness and she feels her right abdomen is larger than her left abdomen. She is able to do most activities. She denies nausea, vomiting, or constipation. ? ?Review of Systems: ?A complete review of systems was obtained from the patient. I have reviewed this information and discussed as appropriate with the patient. See HPI as well for other ROS. ? ?Review of Systems  ?Constitutional: Negative.  ?HENT: Negative.  ?Eyes: Negative.  ?Respiratory: Negative.  ?Cardiovascular: Negative.  ?Gastrointestinal: Positive for abdominal pain.  ?Genitourinary: Negative.  ?Musculoskeletal: Negative.  ?Skin: Negative.  ?Neurological: Negative.  ?Endo/Heme/Allergies: Negative.  ?Psychiatric/Behavioral: Negative.  ? ? ?Medical History: ?Past Medical History:  ?Diagnosis Date  ? Arthritis  ? GERD (gastroesophageal reflux disease)  ? Hypertension  ? Sleep apnea  ? ?There is no problem list on file for this patient. ? ?Past Surgical History:  ?Procedure Laterality Date  ? HYSTERECTOMY  ? LAPAROSCOPIC CHOLECYSTECTOMY N/A  ? ? ?Allergies  ?Allergen Reactions  ? Penicillins Other (See Comments)  ?RASH ?Has patient had a PCN reaction causing immediate rash, facial/tongue/throat swelling, SOB or lightheadedness with hypotension: Yes ?Has patient had a PCN reaction causing severe rash involving mucus membranes or skin necrosis: yES ?Has patient had a PCN reaction that required hospitalization: nO ?Has patient had a PCN reaction occurring within the last 10 years: yES ?If all of the above answers are "NO", then may proceed with Cephalosporin use.  ? Pseudoephedrine Hcl Other (See Comments)  ?[place]  ? ?Current Outpatient Medications on File Prior to  Visit  ?Medication Sig Dispense Refill  ? carvediloL (COREG) 6.25 MG tablet  ? lisinopriL-hydrochlorothiazide (ZESTORETIC) 20-25 mg tablet  ? ?No current facility-administered medications on file prior to visit.  ? ?Family History  ?Problem Relation Age of Onset  ? High blood pressure (Hypertension) Mother  ? Hyperlipidemia (Elevated cholesterol) Mother  ? Colon cancer Father  ? Heart valve disease Brother  ? ? ?Social History  ? ?Tobacco Use  ?Smoking Status Never  ?Smokeless Tobacco Never  ? ? ?Social History  ? ?Socioeconomic History  ? Marital status: Married  ?Tobacco Use  ? Smoking status: Never  ? Smokeless tobacco: Never  ?Vaping Use  ? Vaping Use: Never used  ?Substance and Sexual Activity  ? Alcohol use: Never  ? Drug use: Never  ? ?Objective:  ? ?Vitals:  ?10/21/21 1101  ?BP: 138/86  ?Pulse: 87  ?Temp: 36.7 ?C (98.1 ?F)  ?SpO2: 96%  ?Weight: 94.6 kg (208 lb 9.6 oz)  ?Height: 162.6 cm ('5\' 4"'$ )  ? ?Body mass index is 35.81 kg/m?. ? ?Physical Exam ?Constitutional:  ?Appearance: Normal appearance.  ?HENT:  ?Head: Normocephalic and atraumatic.  ?Pulmonary:  ?Effort: Pulmonary effort is normal.  ?Abdominal:  ?Comments: Small umbilical hernia, infraumbilical scar  ?Musculoskeletal:  ?General: Normal range of motion.  ?Cervical back: Normal range of motion.  ?Neurological:  ?General: No focal deficit present.  ?Mental Status: She is alert and oriented to person, place, and time. Mental status is at baseline.  ?Psychiatric:  ?Mood and Affect: Mood normal.  ?Behavior: Behavior normal.  ?Thought Content: Thought content normal.  ? ?Labs, Imaging and Diagnostic Testing: ?I reviewed notes by Donnie Coffin, MD ? ?  Assessment and Plan:  ? ?Diagnoses and all orders for this visit: ? ?Umbilical hernia without obstruction or gangrene ? ?The patient has a symptomatic reducible hernia. We discussed the etiology of his hernia, the risk of it enlarging, incarceration, obstruction, strangulation, and that it is unlikely to get  smaller or better on its own. We discussed operative options of laparoscopic vs open repair with mesh including the risks of recurrence, injury to intestines or abdominal organs, or chronic pain associated with mesh. We decided to proceed with open umbilical hernia repair with mesh as outpatient.  ?  ?

## 2021-12-10 ENCOUNTER — Encounter (HOSPITAL_COMMUNITY): Payer: Self-pay | Admitting: General Surgery

## 2021-12-10 NOTE — Anesthesia Postprocedure Evaluation (Signed)
Anesthesia Post Note ? ?Patient: NYEEMAH JENNETTE ? ?Procedure(s) Performed: OPEN UMBILICAL HERNIA WITH MESH ? ?  ? ?Patient location during evaluation: PACU ?Anesthesia Type: General ?Level of consciousness: awake and alert ?Pain management: pain level controlled ?Vital Signs Assessment: post-procedure vital signs reviewed and stable ?Respiratory status: spontaneous breathing, nonlabored ventilation and respiratory function stable ?Cardiovascular status: blood pressure returned to baseline and stable ?Postop Assessment: no apparent nausea or vomiting ?Anesthetic complications: no ? ? ?No notable events documented. ? ?Last Vitals:  ?Vitals:  ? 12/09/21 0945 12/09/21 0953  ?BP: (!) 166/88 (!) 164/98  ?Pulse: 75 71  ?Resp: 16 14  ?Temp:  36.7 ?C  ?SpO2: 98% 93%  ?  ?Last Pain:  ?Vitals:  ? 12/09/21 0953  ?TempSrc:   ?PainSc: 2   ? ? ?  ?  ?  ?  ?  ?  ? ?Lynda Rainwater ? ? ? ? ?

## 2021-12-30 DIAGNOSIS — K429 Umbilical hernia without obstruction or gangrene: Secondary | ICD-10-CM | POA: Diagnosis not present

## 2022-02-02 ENCOUNTER — Other Ambulatory Visit: Payer: Self-pay | Admitting: Family Medicine

## 2022-02-02 DIAGNOSIS — Z1231 Encounter for screening mammogram for malignant neoplasm of breast: Secondary | ICD-10-CM

## 2022-02-18 ENCOUNTER — Ambulatory Visit: Payer: Medicare Other

## 2022-02-23 DIAGNOSIS — K429 Umbilical hernia without obstruction or gangrene: Secondary | ICD-10-CM | POA: Diagnosis not present

## 2022-02-24 ENCOUNTER — Ambulatory Visit
Admission: RE | Admit: 2022-02-24 | Discharge: 2022-02-24 | Disposition: A | Discharge status: Home / Self Care | Payer: Medicare Other | Source: Ambulatory Visit | Attending: Family Medicine | Admitting: Family Medicine

## 2022-02-24 DIAGNOSIS — Z1231 Encounter for screening mammogram for malignant neoplasm of breast: Secondary | ICD-10-CM | POA: Diagnosis not present

## 2022-03-21 DIAGNOSIS — I10 Essential (primary) hypertension: Secondary | ICD-10-CM | POA: Diagnosis not present

## 2022-03-21 DIAGNOSIS — Z23 Encounter for immunization: Secondary | ICD-10-CM | POA: Diagnosis not present

## 2022-03-21 DIAGNOSIS — M8580 Other specified disorders of bone density and structure, unspecified site: Secondary | ICD-10-CM | POA: Diagnosis not present

## 2022-03-21 DIAGNOSIS — G4730 Sleep apnea, unspecified: Secondary | ICD-10-CM | POA: Diagnosis not present

## 2022-03-21 DIAGNOSIS — E7800 Pure hypercholesterolemia, unspecified: Secondary | ICD-10-CM | POA: Diagnosis not present

## 2022-03-21 DIAGNOSIS — K219 Gastro-esophageal reflux disease without esophagitis: Secondary | ICD-10-CM | POA: Diagnosis not present

## 2022-03-21 DIAGNOSIS — N3281 Overactive bladder: Secondary | ICD-10-CM | POA: Diagnosis not present

## 2022-03-21 DIAGNOSIS — Z0000 Encounter for general adult medical examination without abnormal findings: Secondary | ICD-10-CM | POA: Diagnosis not present

## 2022-03-21 DIAGNOSIS — H2513 Age-related nuclear cataract, bilateral: Secondary | ICD-10-CM | POA: Diagnosis not present

## 2022-04-04 DIAGNOSIS — G4733 Obstructive sleep apnea (adult) (pediatric): Secondary | ICD-10-CM | POA: Diagnosis not present

## 2022-06-21 DIAGNOSIS — H16302 Unspecified interstitial keratitis, left eye: Secondary | ICD-10-CM | POA: Diagnosis not present

## 2022-06-27 DIAGNOSIS — R1314 Dysphagia, pharyngoesophageal phase: Secondary | ICD-10-CM | POA: Diagnosis not present

## 2022-06-27 DIAGNOSIS — K219 Gastro-esophageal reflux disease without esophagitis: Secondary | ICD-10-CM | POA: Diagnosis not present

## 2022-07-05 DIAGNOSIS — R1310 Dysphagia, unspecified: Secondary | ICD-10-CM | POA: Diagnosis not present

## 2022-07-05 DIAGNOSIS — R1013 Epigastric pain: Secondary | ICD-10-CM | POA: Diagnosis not present

## 2022-07-05 DIAGNOSIS — K222 Esophageal obstruction: Secondary | ICD-10-CM | POA: Diagnosis not present

## 2022-07-05 DIAGNOSIS — K449 Diaphragmatic hernia without obstruction or gangrene: Secondary | ICD-10-CM | POA: Diagnosis not present

## 2022-07-05 DIAGNOSIS — K2100 Gastro-esophageal reflux disease with esophagitis, without bleeding: Secondary | ICD-10-CM | POA: Diagnosis not present

## 2022-07-05 DIAGNOSIS — K2970 Gastritis, unspecified, without bleeding: Secondary | ICD-10-CM | POA: Diagnosis not present

## 2022-07-06 DIAGNOSIS — H18212 Corneal edema secondary to contact lens, left eye: Secondary | ICD-10-CM | POA: Diagnosis not present

## 2022-08-09 DIAGNOSIS — H2513 Age-related nuclear cataract, bilateral: Secondary | ICD-10-CM | POA: Diagnosis not present

## 2022-08-12 DIAGNOSIS — G4733 Obstructive sleep apnea (adult) (pediatric): Secondary | ICD-10-CM | POA: Diagnosis not present

## 2022-08-25 DIAGNOSIS — Z23 Encounter for immunization: Secondary | ICD-10-CM | POA: Diagnosis not present

## 2022-09-21 DIAGNOSIS — G4730 Sleep apnea, unspecified: Secondary | ICD-10-CM | POA: Diagnosis not present

## 2022-09-21 DIAGNOSIS — K222 Esophageal obstruction: Secondary | ICD-10-CM | POA: Diagnosis not present

## 2022-09-21 DIAGNOSIS — N3281 Overactive bladder: Secondary | ICD-10-CM | POA: Diagnosis not present

## 2022-09-21 DIAGNOSIS — K219 Gastro-esophageal reflux disease without esophagitis: Secondary | ICD-10-CM | POA: Diagnosis not present

## 2022-09-21 DIAGNOSIS — I10 Essential (primary) hypertension: Secondary | ICD-10-CM | POA: Diagnosis not present

## 2022-09-21 DIAGNOSIS — Z9989 Dependence on other enabling machines and devices: Secondary | ICD-10-CM | POA: Diagnosis not present

## 2022-09-21 DIAGNOSIS — E7800 Pure hypercholesterolemia, unspecified: Secondary | ICD-10-CM | POA: Diagnosis not present

## 2022-09-21 DIAGNOSIS — K2100 Gastro-esophageal reflux disease with esophagitis, without bleeding: Secondary | ICD-10-CM | POA: Diagnosis not present

## 2022-10-20 DIAGNOSIS — K2100 Gastro-esophageal reflux disease with esophagitis, without bleeding: Secondary | ICD-10-CM | POA: Diagnosis not present

## 2022-10-20 DIAGNOSIS — K222 Esophageal obstruction: Secondary | ICD-10-CM | POA: Diagnosis not present

## 2022-10-20 DIAGNOSIS — K449 Diaphragmatic hernia without obstruction or gangrene: Secondary | ICD-10-CM | POA: Diagnosis not present

## 2022-10-20 DIAGNOSIS — R1310 Dysphagia, unspecified: Secondary | ICD-10-CM | POA: Diagnosis not present

## 2022-11-18 DIAGNOSIS — H2513 Age-related nuclear cataract, bilateral: Secondary | ICD-10-CM | POA: Diagnosis not present

## 2022-11-18 DIAGNOSIS — H25043 Posterior subcapsular polar age-related cataract, bilateral: Secondary | ICD-10-CM | POA: Diagnosis not present

## 2022-11-18 DIAGNOSIS — H25013 Cortical age-related cataract, bilateral: Secondary | ICD-10-CM | POA: Diagnosis not present

## 2022-11-18 DIAGNOSIS — H2511 Age-related nuclear cataract, right eye: Secondary | ICD-10-CM | POA: Diagnosis not present

## 2022-11-18 DIAGNOSIS — H18413 Arcus senilis, bilateral: Secondary | ICD-10-CM | POA: Diagnosis not present

## 2022-12-19 DIAGNOSIS — R1310 Dysphagia, unspecified: Secondary | ICD-10-CM | POA: Diagnosis not present

## 2022-12-19 DIAGNOSIS — Z86010 Personal history of colonic polyps: Secondary | ICD-10-CM | POA: Diagnosis not present

## 2022-12-19 DIAGNOSIS — K219 Gastro-esophageal reflux disease without esophagitis: Secondary | ICD-10-CM | POA: Diagnosis not present

## 2022-12-23 ENCOUNTER — Emergency Department (HOSPITAL_BASED_OUTPATIENT_CLINIC_OR_DEPARTMENT_OTHER)
Admission: EM | Admit: 2022-12-23 | Discharge: 2022-12-23 | Disposition: A | Discharge status: Home / Self Care | Payer: Medicare Other | Attending: Emergency Medicine | Admitting: Emergency Medicine

## 2022-12-23 ENCOUNTER — Other Ambulatory Visit (HOSPITAL_BASED_OUTPATIENT_CLINIC_OR_DEPARTMENT_OTHER): Payer: Self-pay

## 2022-12-23 ENCOUNTER — Other Ambulatory Visit: Payer: Self-pay

## 2022-12-23 ENCOUNTER — Emergency Department (HOSPITAL_BASED_OUTPATIENT_CLINIC_OR_DEPARTMENT_OTHER): Payer: Medicare Other

## 2022-12-23 ENCOUNTER — Encounter (HOSPITAL_BASED_OUTPATIENT_CLINIC_OR_DEPARTMENT_OTHER): Payer: Self-pay | Admitting: Urology

## 2022-12-23 DIAGNOSIS — S32030A Wedge compression fracture of third lumbar vertebra, initial encounter for closed fracture: Secondary | ICD-10-CM

## 2022-12-23 DIAGNOSIS — W010XXA Fall on same level from slipping, tripping and stumbling without subsequent striking against object, initial encounter: Secondary | ICD-10-CM | POA: Diagnosis not present

## 2022-12-23 DIAGNOSIS — G4733 Obstructive sleep apnea (adult) (pediatric): Secondary | ICD-10-CM | POA: Insufficient documentation

## 2022-12-23 DIAGNOSIS — S32039A Unspecified fracture of third lumbar vertebra, initial encounter for closed fracture: Secondary | ICD-10-CM | POA: Insufficient documentation

## 2022-12-23 DIAGNOSIS — M545 Low back pain, unspecified: Secondary | ICD-10-CM | POA: Diagnosis present

## 2022-12-23 DIAGNOSIS — S32010A Wedge compression fracture of first lumbar vertebra, initial encounter for closed fracture: Secondary | ICD-10-CM | POA: Diagnosis not present

## 2022-12-23 DIAGNOSIS — Z79899 Other long term (current) drug therapy: Secondary | ICD-10-CM | POA: Diagnosis not present

## 2022-12-23 DIAGNOSIS — Z043 Encounter for examination and observation following other accident: Secondary | ICD-10-CM | POA: Diagnosis not present

## 2022-12-23 DIAGNOSIS — I10 Essential (primary) hypertension: Secondary | ICD-10-CM | POA: Insufficient documentation

## 2022-12-23 MED ORDER — ACETAMINOPHEN 500 MG PO TABS
1000.0000 mg | ORAL_TABLET | Freq: Once | ORAL | Status: AC
  Administered 2022-12-23: 1000 mg via ORAL
  Filled 2022-12-23: qty 2

## 2022-12-23 MED ORDER — OXYCODONE-ACETAMINOPHEN 5-325 MG PO TABS
1.0000 | ORAL_TABLET | Freq: Four times a day (QID) | ORAL | 0 refills | Status: DC | PRN
  Filled 2022-12-23: qty 10, 3d supply, fill #0

## 2022-12-23 MED ORDER — OXYCODONE-ACETAMINOPHEN 5-325 MG PO TABS: 1.0000 | ORAL_TABLET | Freq: Four times a day (QID) | ORAL | 0 refills | Status: DC | PRN

## 2022-12-23 MED ORDER — LIDOCAINE 5 % EX PTCH
2.0000 | MEDICATED_PATCH | CUTANEOUS | Status: DC
  Administered 2022-12-23: 2 via TRANSDERMAL
  Filled 2022-12-23: qty 2

## 2022-12-23 MED ORDER — OXYCODONE HCL 5 MG PO TABS
5.0000 mg | ORAL_TABLET | Freq: Once | ORAL | Status: DC
  Filled 2022-12-23: qty 1

## 2022-12-23 NOTE — Discharge Instructions (Addendum)
Your CT scan today showed a compression fracture of your lumbar spine and a cyst on your left kidney.  Make sure you follow-up with your primary care physician AND neurosurgery for further evaluation and management.  Take Tylenol/ibuprofen or Percocet as needed for pain.  Please return to the emergency department if new or worsening symptoms we discussed become apparent.

## 2022-12-23 NOTE — ED Provider Notes (Signed)
[place] [facility] Provider Note   CSN: [id number] Arrival date & time: 12/23/22  1349     History  Chief Complaint  Patient presents with   Fall   Back Injury    Kiara Zamora is a 82 y.o. female with a past medical history of hypertension, arthritis, hyperlipidemia presents today after a fall. Patient states she was walking at home today when she tripped and fell. She fell forward, trying to grab a chair but fell with the chair to the L side, hitting her back to a basket of water bottles. She denies hitting her head, LOC. No headache, lightheadedness, nausea, vomiting, vision change. She complains of lower back pain that is described as tightness, constant, non radiating. She denies urinary retention, bowel incontinence, saddle anesthesia, distal weakness.   Fall      Past Medical History:  Diagnosis Date   Arthritis    Cancer (HCC)    Hyperlipidemia    Hypertension    Sleep apnea    CPAP   Past Surgical History:  Procedure Laterality Date   CHOLECYSTECTOMY     ESOPHAGOGASTRODUODENOSCOPY  06/05/2017   OOPHORECTOMY     UMBILICAL HERNIA REPAIR N/A 12/09/2021   Procedure: OPEN UMBILICAL HERNIA WITH MESH;  Surgeon: Kinsinger, De Blanch, MD;  Location: [facility];  Service: General;  Laterality: N/A;   VAGINAL HYSTERECTOMY       Home Medications Prior to Admission medications   Medication Sig Start Date End Date Taking? Authorizing Provider  acetaminophen (TYLENOL) 500 MG tablet Take 1,000 mg by mouth at bedtime as needed (arthritis pain).    [provider]  amLODipine (NORVASC) 10 MG tablet Take 10 mg by mouth every morning. 01/21/14   [provider]  carvedilol (COREG) 6.25 MG tablet Take 12.5 mg by mouth every morning. 01/21/14   [provider]  Cholecalciferol (VITAMIN D-3) 125 MCG (5000 UT) TABS Take 5,000 Units by mouth every morning.    [provider]  Cyanocobalamin (VITAMIN B-12) 5000  MCG TBDP Take 5,000 mcg by mouth every morning.    [provider]  lisinopril-hydrochlorothiazide (PRINZIDE,ZESTORETIC) 20-25 MG per tablet Take 1 tablet by mouth every morning. 1 TAB DAILY 01/21/14   [provider]  Menthol, Topical Analgesic, (BIOFREEZE EX) Apply 1 application. topically daily as needed (knee pain).    [provider]  omeprazole (PRILOSEC OTC) 20 MG tablet Take 40 mg by mouth at bedtime.    [provider]  oxyCODONE (OXY IR/ROXICODONE) 5 MG immediate release tablet Take 1 tablet (5 mg total) by mouth every 6 (six) hours as needed for severe pain. 12/09/21   Kinsinger, De Blanch, MD  oxyCODONE-acetaminophen (PERCOCET/ROXICET) 5-325 MG tablet Take 1 tablet by mouth every 6 (six) hours as needed for severe pain. 12/23/22   Jeanelle Malling, PA  pantoprazole (PROTONIX) 40 MG tablet Take 40 mg by mouth every morning. 05/02/17   [provider]  POTASSIUM GLUCONATE PO Take 1 tablet by mouth every morning.    [provider]  PRESCRIPTION MEDICATION Inhale into the lungs at bedtime. CPAP    [provider]  solifenacin (VESICARE) 10 MG tablet Take 10 mg by mouth every morning.    [provider]      Allergies    Ezetimibe, Welchol [colesevelam], Actonel [risedronate], Atorvastatin, Pravastatin, Red yeast rice extract, Sudafed [pseudoephedrine hcl], and Penicillins    Review of Systems   Review of Systems Negative except as per  HPI.  Physical Exam Updated Vital Signs BP (!) 154/100   Pulse 80   Temp 97.9 F (36.6 C) (Oral)   Resp 18   Ht 5\' 4"  (1.626 m)   Wt 94.3 kg   SpO2 97%   BMI 35.68 kg/m  Physical Exam Vitals and nursing note reviewed.  Constitutional:      Appearance: Normal appearance.  HENT:     Head: Normocephalic and atraumatic.     Mouth/Throat:     Mouth: Mucous membranes are moist.  Eyes:     General: No scleral icterus. Cardiovascular:     Rate and Rhythm: Normal rate and regular  rhythm.     Pulses: Normal pulses.     Heart sounds: Normal heart sounds.  Pulmonary:     Effort: Pulmonary effort is normal.     Breath sounds: Normal breath sounds.  Abdominal:     General: Abdomen is flat.     Palpations: Abdomen is soft.     Tenderness: There is no abdominal tenderness.  Musculoskeletal:        General: No deformity.     Comments: Tenderness to palpation to paraspinal muscles of the lower back with mild midline tenderness.  No hematoma or laceration noted.  Skin:    General: Skin is warm.     Findings: No rash.  Neurological:     General: No focal deficit present.     Mental Status: She is alert.  Psychiatric:        Mood and Affect: Mood normal.     ED Results / Procedures / Treatments   Labs (all labs ordered are listed, but only abnormal results are displayed) Labs Reviewed - No data to display  EKG None  Radiology CT Lumbar Spine Wo Contrast  Addendum Date: 12/23/2022   ADDENDUM REPORT: 12/23/2022 17:03 ADDENDUM: Paraspinal and other soft tissues: Large left renal cyst, the imaged portion of which appears simple, for which no follow-up is currently indicated. A right inferior pole cyst also measures simple fluid density. Soft tissue extending from the inferior pole of the left kidney (series 8, image 82) likely represents adjacent bowel; however no definite fat plane is seen between the kidney and this structure. While this could be artifactual, it could be further evaluated with CT abdomen pelvis. Electronically Signed   By: Wiliam Ke M.D.   On: 12/23/2022 17:03   Result Date: 12/23/2022 CLINICAL DATA:  Compression fracture EXAM: CT LUMBAR SPINE WITHOUT CONTRAST TECHNIQUE: Multidetector CT imaging of the lumbar spine was performed without intravenous contrast administration. Multiplanar CT image reconstructions were also generated. RADIATION DOSE REDUCTION: This exam was performed according to the departmental dose-optimization program which includes  automated exposure control, adjustment of the mA and/or kV according to patient size and/or use of iterative reconstruction technique. COMPARISON:  No prior lumbar spine CT, correlation is made with lumbar spine radiographs 12/23/2022 FINDINGS: Segmentation: The lowest rib-bearing vertebral body is labeled T12, where there are hypoplastic ribs. 5 lumbar type vertebral bodies. Alignment: Mild levocurvature. 6 mm left lateral listhesis of L4 on L5. No anterolisthesis or retrolisthesis. Vertebrae: Compression fracture at L3, with 20% vertebral body height loss anteriorly and 4 mm retropulsion of the posterosuperior cortex, favored to be acute to subacute. Vertebral body heights are otherwise preserved. Endplate degenerative changes at L3-L4, eccentric to the right. Paraspinal and other soft tissues: Large left renal cyst, the imaged portion of which appears simple, for which no follow-up is currently indicated. A right inferior pole  cyst does not Disc levels: T12-L1: No significant disc bulge. No spinal canal stenosis or neural foraminal narrowing. L1-L2: No significant disc bulge. No spinal canal stenosis or neural foraminal narrowing. L2-L3: Minimal disc bulge with left foraminal protrusion. Mild-to-moderate facet arthropathy. No spinal canal stenosis at the disc level, however there is likely mild spinal canal stenosis posterior to L3, secondary to retropulsion of the posterosuperior cortex of L3. No neural foraminal narrowing. L3-L4: Disc height loss and disc osteophyte complex. Moderate right and mild left facet arthropathy. No spinal canal stenosis or neural foraminal narrowing. L4-L5: Right eccentric disc bulge, with right extreme lateral protrusion, which may contact the exiting right L4 nerve. Moderate facet arthropathy. No spinal canal stenosis or neural foraminal narrowing. L5-S1: Minimal disc bulge. Mild facet arthropathy. No spinal canal stenosis. Mild left neural foraminal narrowing. IMPRESSION: 1.  Compression fracture at L3, with 20% vertebral body height loss anteriorly and 4 mm retropulsion of the posterosuperior cortex, favored to be acute to subacute. Mild spinal canal stenosis posterior to L3. 2. Right eccentric disc bulge at L4-L5, with right extreme lateral protrusion, which may contact the exiting right L4 nerve. 3. Hypoplastic ribs at the lowest rib-bearing vertebral body, which is labeled T12. Please correlate with imaging if any intervention is planned. Electronically Signed: By: Wiliam Ke M.D. On: 12/23/2022 16:50   DG Lumbar Spine Complete  Result Date: 12/23/2022 CLINICAL DATA:  Fall EXAM: LUMBAR SPINE - COMPLETE 4 VIEW COMPARISON:  None Available. FINDINGS: Five lumbar type vertebral bodies. There is mild superior endplate compression deformity at L4, favored to be acute. Mild disc space loss in the lower lumbar spine at L4-L5 and L5-S1. Symmetric SI joints. Surgical clips in the right upper quadrant. There is an additional surgical clips that projects over the left lower quadrant. IMPRESSION: Mild superior endplate compression deformity at L4, favored to be acute. Electronically Signed   By: Lorenza Cambridge M.D.   On: 12/23/2022 14:33    Procedures Procedures    Medications Ordered in ED Medications  lidocaine (LIDODERM) 5 % 2 patch (2 patches Transdermal Patch Applied 12/23/22 1559)  oxyCODONE (Oxy IR/ROXICODONE) immediate release tablet 5 mg (5 mg Oral Patient Refused/Not Given 12/23/22 1617)  acetaminophen (TYLENOL) tablet 1,000 mg (1,000 mg Oral Given 12/23/22 1559)    ED Course/ Medical Decision Making/ A&P Clinical Course as of 12/23/22 2110  Fri Dec 23, 2022  1658 Patient is presenting to the ED after mechanical fall at home, striking her back against a table or a basket.  Also reporting pain in the middle of her back, worse with movement, better at rest.  No radiculopathy into her lower legs.  She is able to ambulate.  She does have spinal midline tenderness of the  L-spine specifically on exam.  Neurological motor testing is intact.  X-ray imaging and CT imaging subsequently showed concern for an L3 compression fracture, approximately 20%, likely the cause of her symptoms.  There is noted to be disc protrusion at the L4-L5 region, but no corresponding radiculopathy or weakness to raise concern for high-grade nerve root compression at this time.  The patient would benefit from a TLSO brace.  We discussed pain control at home, including Tylenol, occasional ibuprofen, and will give a few Percocet for breakthrough pain.  She will need to follow-up in the spine clinic.  Both the patient and her husband verbalized understanding. [MT]    Clinical Course User Index [MT] Trifan, Kermit Balo, MD  Medical Decision Making Amount and/or Complexity of Data Reviewed Radiology: ordered.  Risk OTC drugs. Prescription drug management.   This patient presents to the ED for lower back pain, this involves an extensive number of treatment options, and is a complaint that carries with a high risk of complications and morbidity.  The differential diagnosis includes fracture, dislocation, herniated disks, cauda equina.  This is not an exhaustive list.  Imaging studies: I ordered imaging studies. I personally reviewed, interpreted imaging and agree with the radiologist's interpretations. The results include: CT scan showed compression fracture at L3 with 20% vertebral body height loss, disc protrusion at the L4-L5 region, left renal cyst.  Problem list/ ED course/ Critical interventions/ Medical management: HPI: See above Vital signs within normal range and stable throughout visit. Laboratory/imaging studies significant for: See above. On physical examination, patient is afebrile and appears in no acute distress.  There was tenderness to palpation to paraspinal muscles and midline of lumbar spine.  CT scan showed compression fracture at L3 with 20%  vertebral body height loss and disc protrusion at L4-L5 region.  No corresponding radiculopathy or weakness to raise concern for high-grade compression at this time.  Patient is ambulatory.  TSO brace ordered.  I will send a short course of Percocet for breakthrough pain.  Advised patient to take Tylenol and ibuprofen or Percocet as needed for pain, follow-up with neurosurgery and PCP for further evaluation and management.  Strict return precaution given.  I have reviewed the patient home medicines and have made adjustments as needed.  Cardiac monitoring/EKG: The patient was maintained on a cardiac monitor.  I personally reviewed and interpreted the cardiac monitor which showed an underlying rhythm of: sinus rhythm.  Additional history obtained: External records from outside source obtained and reviewed including: Chart review including previous notes, labs, imaging.  Consultations obtained:  Disposition Continued outpatient therapy. Follow-up with neurosurgery recommended for reevaluation of symptoms. Treatment plan discussed with patient.  Pt acknowledged understanding was agreeable to the plan. Worrisome signs and symptoms were discussed with patient, and patient acknowledged understanding to return to the ED if they noticed these signs and symptoms. Patient was stable upon discharge.   This chart was dictated using voice recognition software.  Despite best efforts to proofread,  errors can occur which can change the documentation meaning.          Final Clinical Impression(s) / ED Diagnoses Final diagnoses:  Compression fracture of L3 lumbar vertebra, closed, initial encounter [facility])    Rx / DC Orders ED Discharge Orders          Ordered    oxyCODONE-acetaminophen (PERCOCET/ROXICET) 5-325 MG tablet  Every 6 hours PRN,   Status:  Discontinued        12/23/22 1714    oxyCODONE-acetaminophen (PERCOCET/ROXICET) 5-325 MG tablet  Every 6 hours PRN        12/23/22 1723               Jeanelle Malling, PA 12/23/22 2122    Terald Sleeper, MD 12/23/22 [phone]

## 2022-12-23 NOTE — ED Notes (Signed)
Tech from [employer] at bedside to fit for brace

## 2022-12-23 NOTE — ED Notes (Signed)
Called [facility] for tech to fit for TSLO brace

## 2022-12-23 NOTE — ED Triage Notes (Addendum)
Pt states fell this am and landed on a basket full of water bottles, reports injury to lower back  Took tylenol pta  Denies Head injury, denies LOC at time of fall  No blood thinners   Pt hypertensive, states took BP meds PTA

## 2022-12-28 ENCOUNTER — Other Ambulatory Visit (HOSPITAL_BASED_OUTPATIENT_CLINIC_OR_DEPARTMENT_OTHER): Payer: Self-pay | Admitting: Family Medicine

## 2022-12-28 DIAGNOSIS — N2889 Other specified disorders of kidney and ureter: Secondary | ICD-10-CM

## 2022-12-28 DIAGNOSIS — S32000A Wedge compression fracture of unspecified lumbar vertebra, initial encounter for closed fracture: Secondary | ICD-10-CM | POA: Diagnosis not present

## 2022-12-28 DIAGNOSIS — Z9181 History of falling: Secondary | ICD-10-CM | POA: Diagnosis not present

## 2022-12-29 ENCOUNTER — Ambulatory Visit (HOSPITAL_BASED_OUTPATIENT_CLINIC_OR_DEPARTMENT_OTHER)
Admission: RE | Admit: 2022-12-29 | Discharge: 2022-12-29 | Disposition: A | Discharge status: Home / Self Care | Payer: Medicare Other | Source: Ambulatory Visit | Attending: Family Medicine | Admitting: Family Medicine

## 2022-12-29 DIAGNOSIS — K5730 Diverticulosis of large intestine without perforation or abscess without bleeding: Secondary | ICD-10-CM | POA: Diagnosis not present

## 2022-12-29 DIAGNOSIS — N2889 Other specified disorders of kidney and ureter: Secondary | ICD-10-CM | POA: Diagnosis not present

## 2022-12-29 DIAGNOSIS — N281 Cyst of kidney, acquired: Secondary | ICD-10-CM | POA: Diagnosis not present

## 2022-12-29 MED ORDER — IOHEXOL 300 MG/ML  SOLN
100.0000 mL | Freq: Once | INTRAMUSCULAR | Status: AC | PRN
  Administered 2022-12-29: 100 mL via INTRAVENOUS

## 2023-01-04 DIAGNOSIS — S32030A Wedge compression fracture of third lumbar vertebra, initial encounter for closed fracture: Secondary | ICD-10-CM | POA: Diagnosis not present

## 2023-02-02 DIAGNOSIS — G4733 Obstructive sleep apnea (adult) (pediatric): Secondary | ICD-10-CM | POA: Diagnosis not present

## 2023-02-03 DIAGNOSIS — H2512 Age-related nuclear cataract, left eye: Secondary | ICD-10-CM | POA: Diagnosis not present

## 2023-02-03 DIAGNOSIS — H2511 Age-related nuclear cataract, right eye: Secondary | ICD-10-CM | POA: Diagnosis not present

## 2023-02-17 DIAGNOSIS — H2512 Age-related nuclear cataract, left eye: Secondary | ICD-10-CM | POA: Diagnosis not present

## 2023-03-28 DIAGNOSIS — Z0000 Encounter for general adult medical examination without abnormal findings: Secondary | ICD-10-CM | POA: Diagnosis not present

## 2023-03-28 DIAGNOSIS — I10 Essential (primary) hypertension: Secondary | ICD-10-CM | POA: Diagnosis not present

## 2023-03-28 DIAGNOSIS — I700 Atherosclerosis of aorta: Secondary | ICD-10-CM | POA: Diagnosis not present

## 2023-03-28 DIAGNOSIS — K219 Gastro-esophageal reflux disease without esophagitis: Secondary | ICD-10-CM | POA: Diagnosis not present

## 2023-03-28 DIAGNOSIS — Z9989 Dependence on other enabling machines and devices: Secondary | ICD-10-CM | POA: Diagnosis not present

## 2023-03-28 DIAGNOSIS — E7800 Pure hypercholesterolemia, unspecified: Secondary | ICD-10-CM | POA: Diagnosis not present

## 2023-03-28 DIAGNOSIS — G4730 Sleep apnea, unspecified: Secondary | ICD-10-CM | POA: Diagnosis not present

## 2023-04-12 ENCOUNTER — Other Ambulatory Visit: Payer: Self-pay | Admitting: Family Medicine

## 2023-04-12 DIAGNOSIS — Z1231 Encounter for screening mammogram for malignant neoplasm of breast: Secondary | ICD-10-CM

## 2023-04-27 ENCOUNTER — Ambulatory Visit
Admission: RE | Admit: 2023-04-27 | Discharge: 2023-04-27 | Disposition: A | Discharge status: Home / Self Care | Payer: Medicare Other | Source: Ambulatory Visit | Attending: Family Medicine | Admitting: Family Medicine

## 2023-04-27 DIAGNOSIS — Z1231 Encounter for screening mammogram for malignant neoplasm of breast: Secondary | ICD-10-CM

## 2023-06-10 DIAGNOSIS — Z23 Encounter for immunization: Secondary | ICD-10-CM | POA: Diagnosis not present

## 2023-11-20 ENCOUNTER — Encounter (HOSPITAL_BASED_OUTPATIENT_CLINIC_OR_DEPARTMENT_OTHER): Payer: Self-pay | Admitting: Emergency Medicine

## 2023-11-20 ENCOUNTER — Other Ambulatory Visit: Payer: Self-pay

## 2023-11-20 ENCOUNTER — Observation Stay (HOSPITAL_BASED_OUTPATIENT_CLINIC_OR_DEPARTMENT_OTHER)
Admission: EM | Admit: 2023-11-20 | Discharge: 2023-11-22 | Disposition: A | Discharge status: Home / Self Care | Attending: Internal Medicine | Admitting: Internal Medicine

## 2023-11-20 ENCOUNTER — Emergency Department (HOSPITAL_BASED_OUTPATIENT_CLINIC_OR_DEPARTMENT_OTHER)

## 2023-11-20 DIAGNOSIS — Z859 Personal history of malignant neoplasm, unspecified: Secondary | ICD-10-CM | POA: Diagnosis not present

## 2023-11-20 DIAGNOSIS — E66811 Obesity, class 1: Secondary | ICD-10-CM | POA: Diagnosis not present

## 2023-11-20 DIAGNOSIS — Z6832 Body mass index (BMI) 32.0-32.9, adult: Secondary | ICD-10-CM | POA: Diagnosis not present

## 2023-11-20 DIAGNOSIS — E785 Hyperlipidemia, unspecified: Secondary | ICD-10-CM | POA: Diagnosis not present

## 2023-11-20 DIAGNOSIS — I63342 Cerebral infarction due to thrombosis of left cerebellar artery: Principal | ICD-10-CM | POA: Insufficient documentation

## 2023-11-20 DIAGNOSIS — G4733 Obstructive sleep apnea (adult) (pediatric): Secondary | ICD-10-CM | POA: Diagnosis not present

## 2023-11-20 DIAGNOSIS — Z8673 Personal history of transient ischemic attack (TIA), and cerebral infarction without residual deficits: Secondary | ICD-10-CM | POA: Diagnosis present

## 2023-11-20 DIAGNOSIS — Z79899 Other long term (current) drug therapy: Secondary | ICD-10-CM | POA: Diagnosis not present

## 2023-11-20 DIAGNOSIS — I10 Essential (primary) hypertension: Secondary | ICD-10-CM | POA: Diagnosis not present

## 2023-11-20 DIAGNOSIS — I639 Cerebral infarction, unspecified: Principal | ICD-10-CM | POA: Diagnosis present

## 2023-11-20 DIAGNOSIS — R42 Dizziness and giddiness: Secondary | ICD-10-CM | POA: Diagnosis present

## 2023-11-20 LAB — BASIC METABOLIC PANEL
Anion gap: 7 (ref 5–15)
BUN: 9 mg/dL (ref 8–23)
CO2: 29 mmol/L (ref 22–32)
Calcium: 10 mg/dL (ref 8.9–10.3)
Chloride: 100 mmol/L (ref 98–111)
Creatinine, Ser: 0.6 mg/dL (ref 0.44–1.00)
GFR, Estimated: >60 mL/min (ref >60)
Glucose, Bld: 126 mg/dL — ABNORMAL HIGH (ref 70–99)
Potassium: 3.2 mmol/L — ABNORMAL LOW (ref 3.5–5.1)
Sodium: 136 mmol/L (ref 135–145)

## 2023-11-20 LAB — CBC
HCT: 40.2 % (ref 36.0–46.0)
Hemoglobin: 13.6 g/dL (ref 12.0–15.0)
MCH: 30.4 pg (ref 26.0–34.0)
MCHC: 33.8 g/dL (ref 30.0–36.0)
MCV: 89.9 fL (ref 80.0–100.0)
Platelets: 339 10*3/uL (ref 150–400)
RBC: 4.47 MIL/uL (ref 3.87–5.11)
RDW: 12.5 % (ref 11.5–15.5)
WBC: 8.6 10*3/uL (ref 4.0–10.5)
nRBC: 0 % (ref 0.0–0.2)

## 2023-11-20 LAB — URINALYSIS, ROUTINE W REFLEX MICROSCOPIC
Bilirubin Urine: NEGATIVE
Glucose, UA: NEGATIVE mg/dL
Hgb urine dipstick: NEGATIVE
Ketones, ur: NEGATIVE mg/dL
Leukocytes,Ua: NEGATIVE
Nitrite: NEGATIVE
Protein, ur: NEGATIVE mg/dL
Specific Gravity, Urine: 1.01 (ref 1.005–1.030)
pH: 7 (ref 5.0–8.0)

## 2023-11-20 LAB — CBG MONITORING, ED: Glucose-Capillary: 119 mg/dL — ABNORMAL HIGH (ref 70–99)

## 2023-11-20 MED ORDER — POTASSIUM CHLORIDE CRYS ER 20 MEQ PO TBCR
40.0000 meq | EXTENDED_RELEASE_TABLET | Freq: Once | ORAL | Status: AC
  Administered 2023-11-20: 40 meq via ORAL
  Filled 2023-11-20: qty 2

## 2023-11-20 MED ORDER — IOHEXOL 350 MG/ML SOLN
75.0000 mL | Freq: Once | INTRAVENOUS | Status: AC | PRN
  Administered 2023-11-20: 75 mL via INTRAVENOUS

## 2023-11-20 NOTE — ED Triage Notes (Addendum)
 Pt POV in wheelchair- c/o vertigo like sx since Friday AM, progressively worsening.   C/o nausea at time of triage. Denies emesis/diarrhea.   VAN -, BEFAST -

## 2023-11-20 NOTE — ED Provider Notes (Signed)
 [place] [facility] Provider Note   CSN: [id number] Arrival date & time: 11/20/23  1752     History  Chief Complaint  Patient presents with   Dizziness    Kiara Zamora is a 83 y.o. female.  HPI 83 year old female with a history of hypertension and vertigo presents with dizziness that started on 3/21.  She thought it was her typical vertigo but this feels "stronger".  Has not gone away like typical, despite taking her meclizine.  She has a mild occipital headache or at least a discomfort.  No vision changes such as double vision.  No speech difficulty, weakness or numbness in the extremities.  He is able to walk but feels like she leans to a side.  Home Medications Prior to Admission medications   Medication Sig Start Date End Date Taking? Authorizing Provider  acetaminophen (TYLENOL) 500 MG tablet Take 1,000 mg by mouth at bedtime as needed (arthritis pain).    [provider]  amLODipine (NORVASC) 10 MG tablet Take 10 mg by mouth every morning. 01/21/14   [provider]  carvedilol (COREG) 6.25 MG tablet Take 12.5 mg by mouth every morning. 01/21/14   [provider]  Cholecalciferol (VITAMIN D-3) 125 MCG (5000 UT) TABS Take 5,000 Units by mouth every morning.    [provider]  Cyanocobalamin (VITAMIN B-12) 5000 MCG TBDP Take 5,000 mcg by mouth every morning.    [provider]  lisinopril-hydrochlorothiazide (PRINZIDE,ZESTORETIC) 20-25 MG per tablet Take 1 tablet by mouth every morning. 1 TAB DAILY 01/21/14   [provider]  Menthol, Topical Analgesic, (BIOFREEZE EX) Apply 1 application. topically daily as needed (knee pain).    [provider]  omeprazole (PRILOSEC OTC) 20 MG tablet Take 40 mg by mouth at bedtime.    [provider]  oxyCODONE (OXY IR/ROXICODONE) 5 MG immediate release tablet Take 1 tablet (5 mg total) by mouth every 6 (six) hours as needed for severe  pain. 12/09/21   Kinsinger, De Blanch, MD  oxyCODONE-acetaminophen (PERCOCET/ROXICET) 5-325 MG tablet Take 1 tablet by mouth every 6 (six) hours as needed for severe pain. 12/23/22   Jeanelle Malling, PA  pantoprazole (PROTONIX) 40 MG tablet Take 40 mg by mouth every morning. 05/02/17   [provider]  POTASSIUM GLUCONATE PO Take 1 tablet by mouth every morning.    [provider]  PRESCRIPTION MEDICATION Inhale into the lungs at bedtime. CPAP    [provider]  solifenacin (VESICARE) 10 MG tablet Take 10 mg by mouth every morning.    [provider]      Allergies    Ezetimibe, Welchol [colesevelam], Actonel [risedronate], Atorvastatin, Pravastatin, Red yeast rice extract, Sudafed [pseudoephedrine hcl], and Penicillins    Review of Systems   Review of Systems  Eyes:  Negative for visual disturbance.  Neurological:  Positive for dizziness and headaches. Negative for weakness and numbness.    Physical Exam Updated Vital Signs BP (!) 201/105 (BP Location: Left Arm)   Pulse 87   Temp 97.7 F (36.5 C) (Oral)   Resp 16   Ht 5\' 5"  (1.651 m)   Wt 89.1 kg   SpO2 98%   BMI 32.70 kg/m  Physical Exam Vitals and nursing note reviewed.  Constitutional:      General: She is not in acute distress.    Appearance: She is well-developed. She is not ill-appearing or diaphoretic.  HENT:     Head: Normocephalic and  atraumatic.  Eyes:     Extraocular Movements: Extraocular movements intact.     Pupils: Pupils are equal, round, and reactive to light.  Cardiovascular:     Rate and Rhythm: Normal rate and regular rhythm.     Heart sounds: Normal heart sounds.  Pulmonary:     Effort: Pulmonary effort is normal.     Breath sounds: Normal breath sounds.  Abdominal:     Palpations: Abdomen is soft.     Tenderness: There is no abdominal tenderness.  Skin:    General: Skin is warm and dry.  Neurological:     Mental Status: She is alert.     Comments: CN 3-12 grossly  intact. 5/5 strength in all 4 extremities. Grossly normal sensation. Normal finger to nose. Normal heel to shin. Is able to ambulate on her own but feels unsteady.     ED Results / Procedures / Treatments   Labs (all labs ordered are listed, but only abnormal results are displayed) Labs Reviewed  BASIC METABOLIC PANEL - Abnormal; Notable for the following components:      Result Value   Potassium 3.2 (*)    Glucose, Bld 126 (*)    All other components within normal limits  CBG MONITORING, ED - Abnormal; Notable for the following components:   Glucose-Capillary 119 (*)    All other components within normal limits  CBC  URINALYSIS, ROUTINE W REFLEX MICROSCOPIC    EKG None  Radiology CT HEAD WO CONTRAST Result Date: 11/20/2023 CLINICAL DATA:  Neuro deficit, acute, stroke suspected 83 y/o female. - c/o vertigo like sx since Friday AM, progressively worsening EXAM: CT HEAD WITHOUT CONTRAST TECHNIQUE: Contiguous axial images were obtained from the base of the skull through the vertex without intravenous contrast. RADIATION DOSE REDUCTION: This exam was performed according to the departmental dose-optimization program which includes automated exposure control, adjustment of the mA and/or kV according to patient size and/or use of iterative reconstruction technique. COMPARISON:  None Available. FINDINGS: Brain: Patchy and confluent areas of decreased attenuation are noted throughout the deep and periventricular white matter of the cerebral hemispheres bilaterally, compatible with chronic microvascular ischemic disease. Left cerebellar hypodensity, but high density than the cerebrospinal fluid, with associated loss of gray-white matter differentiation. Chronic left basal ganglia lacunar infarction. No parenchymal hemorrhage. No mass lesion. No extra-axial collection. No mass effect or midline shift. No hydrocephalus. Basilar cisterns are patent. Vascular: No hyperdense vessel. Skull: No acute  fracture or focal lesion. Sinuses/Orbits: Paranasal sinuses and mastoid air cells are clear. Bilateral lens replacement. Otherwise the orbits are unremarkable. Other: None. 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 Consider correlation with MRI noncontrast. Electronically Signed   By: Tish Frederickson M.D.   On: 11/20/2023 20:17    Procedures Procedures    Medications Ordered in ED Medications  potassium chloride SA (KLOR-CON M) CR tablet 40 mEq (40 mEq Oral Given 11/20/23 2225)  iohexol (OMNIPAQUE) 350 MG/ML injection 75 mL (75 mLs Intravenous Contrast Given 11/20/23 2248)    ED Course/ Medical Decision Making/ A&P                                 Medical Decision Making Amount and/or Complexity of Data Reviewed Labs: ordered.    Details: hypokalemia Radiology: ordered and independent interpretation performed.    Details: Cerebellar infarct ECG/medicine tests: ordered and independent interpretation performed.    Details: Sinus rhythm  Risk  Prescription drug management. Decision regarding hospitalization.   Patient is found to have a cerebellar stroke on CT.  Unclear exact onset but she is never had a stroke before and with her persistent dizziness sounding like vertigo this is most likely subacute starting 3 days ago.  Discussed with neurology, Dr. Derry Lory, and he agrees with admission given her age and risk factors and CT findings.  She can get an MRI while in the hospital.  He also advises a CTA to assess her basilar arteries.  Otherwise she will be allowed to have permissive hypertension and will need admission to the hospital service.  Discussed with Dr. Arlean Hopping.        Final Clinical Impression(s) / ED Diagnoses Final diagnoses:  Cerebellar infarct [facility])    Rx / DC Orders ED Discharge Orders     None         Pricilla Loveless, MD 11/20/23 2309

## 2023-11-20 NOTE — ED Notes (Signed)
 Patient transported to CT

## 2023-11-21 ENCOUNTER — Other Ambulatory Visit: Payer: Self-pay | Admitting: Cardiology

## 2023-11-21 ENCOUNTER — Observation Stay (HOSPITAL_BASED_OUTPATIENT_CLINIC_OR_DEPARTMENT_OTHER)

## 2023-11-21 ENCOUNTER — Observation Stay (HOSPITAL_COMMUNITY)

## 2023-11-21 DIAGNOSIS — E66811 Obesity, class 1: Secondary | ICD-10-CM | POA: Diagnosis not present

## 2023-11-21 DIAGNOSIS — Z859 Personal history of malignant neoplasm, unspecified: Secondary | ICD-10-CM | POA: Diagnosis not present

## 2023-11-21 DIAGNOSIS — I63342 Cerebral infarction due to thrombosis of left cerebellar artery: Secondary | ICD-10-CM | POA: Diagnosis not present

## 2023-11-21 DIAGNOSIS — Z79899 Other long term (current) drug therapy: Secondary | ICD-10-CM | POA: Diagnosis not present

## 2023-11-21 DIAGNOSIS — I639 Cerebral infarction, unspecified: Secondary | ICD-10-CM

## 2023-11-21 DIAGNOSIS — Z6832 Body mass index (BMI) 32.0-32.9, adult: Secondary | ICD-10-CM | POA: Diagnosis not present

## 2023-11-21 DIAGNOSIS — I10 Essential (primary) hypertension: Secondary | ICD-10-CM

## 2023-11-21 DIAGNOSIS — E785 Hyperlipidemia, unspecified: Secondary | ICD-10-CM | POA: Diagnosis not present

## 2023-11-21 DIAGNOSIS — I6389 Other cerebral infarction: Secondary | ICD-10-CM

## 2023-11-21 DIAGNOSIS — R42 Dizziness and giddiness: Secondary | ICD-10-CM | POA: Diagnosis present

## 2023-11-21 LAB — ECHOCARDIOGRAM COMPLETE
AR max vel: 2.44 cm2
AV Peak grad: 6.9 mmHg
Ao pk vel: 1.31 m/s
Area-P 1/2: 2.22 cm2
Height: 65 in
MV VTI: 2.04 cm2
S' Lateral: 3.4 cm
Weight: 3144 [oz_av]

## 2023-11-21 LAB — COMPREHENSIVE METABOLIC PANEL
ALT: 12 U/L (ref 0–44)
AST: 17 U/L (ref 15–41)
Albumin: 3.5 g/dL (ref 3.5–5.0)
Alkaline Phosphatase: 67 U/L (ref 38–126)
Anion gap: 6 (ref 5–15)
BUN: 5 mg/dL — ABNORMAL LOW (ref 8–23)
CO2: 26 mmol/L (ref 22–32)
Calcium: 9.5 mg/dL (ref 8.9–10.3)
Chloride: 108 mmol/L (ref 98–111)
Creatinine, Ser: 0.57 mg/dL (ref 0.44–1.00)
GFR, Estimated: >60 mL/min (ref >60)
Glucose, Bld: 90 mg/dL (ref 70–99)
Potassium: 3.6 mmol/L (ref 3.5–5.1)
Sodium: 140 mmol/L (ref 135–145)
Total Bilirubin: 0.6 mg/dL (ref 0.0–1.2)
Total Protein: 6.6 g/dL (ref 6.5–8.1)

## 2023-11-21 LAB — LIPID PANEL
Cholesterol: 212 mg/dL — ABNORMAL HIGH (ref 0–200)
HDL: 53 mg/dL (ref >40)
LDL Cholesterol: 143 mg/dL — ABNORMAL HIGH (ref 0–99)
Total CHOL/HDL Ratio: 4 ratio
Triglycerides: 81 mg/dL (ref <150)
VLDL: 16 mg/dL (ref 0–40)

## 2023-11-21 LAB — HEMOGLOBIN A1C
Hgb A1c MFr Bld: 5.4 % (ref 4.8–5.6)
Mean Plasma Glucose: 108.28 mg/dL

## 2023-11-21 MED ORDER — SENNOSIDES-DOCUSATE SODIUM 8.6-50 MG PO TABS: 1.0000 | ORAL_TABLET | Freq: Every evening | ORAL | Status: DC | PRN

## 2023-11-21 MED ORDER — ACETAMINOPHEN 325 MG PO TABS: 650.0000 mg | ORAL_TABLET | ORAL | Status: DC | PRN

## 2023-11-21 MED ORDER — ENOXAPARIN SODIUM 40 MG/0.4ML IJ SOSY
40.0000 mg | PREFILLED_SYRINGE | Freq: Every day | INTRAMUSCULAR | Status: DC
  Administered 2023-11-21: 40 mg via SUBCUTANEOUS
  Filled 2023-11-21: qty 0.4

## 2023-11-21 MED ORDER — ONDANSETRON HCL 4 MG/2ML IJ SOLN: 4.0000 mg | Freq: Four times a day (QID) | INTRAMUSCULAR | Status: DC | PRN

## 2023-11-21 MED ORDER — ASPIRIN 81 MG PO TBEC
81.0000 mg | DELAYED_RELEASE_TABLET | Freq: Every day | ORAL | Status: DC
  Administered 2023-11-21: 81 mg via ORAL
  Filled 2023-11-21: qty 1

## 2023-11-21 MED ORDER — SODIUM CHLORIDE 0.9 % IV SOLN: INTRAVENOUS | Status: AC

## 2023-11-21 MED ORDER — HYDRALAZINE HCL 20 MG/ML IJ SOLN
5.0000 mg | INTRAMUSCULAR | Status: DC | PRN
  Filled 2023-11-21: qty 1

## 2023-11-21 MED ORDER — ACETAMINOPHEN 160 MG/5ML PO SOLN: 650.0000 mg | ORAL | Status: DC | PRN

## 2023-11-21 MED ORDER — ACETAMINOPHEN 650 MG RE SUPP: 650.0000 mg | RECTAL | Status: DC | PRN

## 2023-11-21 MED ORDER — PANTOPRAZOLE SODIUM 40 MG PO TBEC
40.0000 mg | DELAYED_RELEASE_TABLET | Freq: Every day | ORAL | Status: DC
  Administered 2023-11-21: 40 mg via ORAL
  Filled 2023-11-21: qty 1

## 2023-11-21 MED ORDER — CLOPIDOGREL BISULFATE 75 MG PO TABS
75.0000 mg | ORAL_TABLET | Freq: Every day | ORAL | Status: DC
  Administered 2023-11-21: 75 mg via ORAL
  Filled 2023-11-21: qty 1

## 2023-11-21 MED ORDER — STROKE: EARLY STAGES OF RECOVERY BOOK
Freq: Once | Status: DC
  Filled 2023-11-21 (×2): qty 1

## 2023-11-21 MED ORDER — LORAZEPAM 2 MG/ML IJ SOLN
1.0000 mg | Freq: Once | INTRAMUSCULAR | Status: AC
  Administered 2023-11-21: 1 mg via INTRAVENOUS
  Filled 2023-11-21: qty 1

## 2023-11-21 NOTE — Progress Notes (Addendum)
 STROKE TEAM PROGRESS NOTE    SIGNIFICANT HOSPITAL EVENTS Patient presented with 4-day history of vertigo dizziness and walking like a drunk.  CT head showed subacute left cerebellar infarct.  CT angiogram showed no large vessel stenosis or occlusion.  INTERIM HISTORY/SUBJECTIVE Patient is in bed comfortably.  She was able to get up and walk with the therapist. MRI scan shows moderate size left PICA infarct and small right medial cerebellar infarct.  Several areas of chronic microhemorrhages and mild degree of generalized cerebral atrophy.  Echocardiogram is pending.  OBJECTIVE  CBC    Component Value Date/Time   WBC 8.6 11/20/2023 1810   RBC 4.47 11/20/2023 1810   HGB 13.6 11/20/2023 1810   HCT 40.2 11/20/2023 1810   PLT 339 11/20/2023 1810   MCV 89.9 11/20/2023 1810   MCH 30.4 11/20/2023 1810   MCHC 33.8 11/20/2023 1810   RDW 12.5 11/20/2023 1810    BMET    Component Value Date/Time   NA 140 11/21/2023 0440   K 3.6 11/21/2023 0440   CL 108 11/21/2023 0440   CO2 26 11/21/2023 0440   GLUCOSE 90 11/21/2023 0440   BUN 5 (L) 11/21/2023 0440   CREATININE 0.57 11/21/2023 0440   CALCIUM 9.5 11/21/2023 0440   GFRNONAA >60 11/21/2023 0440    IMAGING past 24 hours CT ANGIO HEAD NECK W WO CM Result Date: 11/21/2023 CLINICAL DATA:  Stroke/TIA, determine embolic source EXAM: CT ANGIOGRAPHY HEAD AND NECK WITH AND WITHOUT CONTRAST TECHNIQUE: Multidetector CT imaging of the head and neck was performed using the standard protocol during bolus administration of intravenous contrast. Multiplanar CT image reconstructions and MIPs were obtained to evaluate the vascular anatomy. Carotid stenosis measurements (when applicable) are obtained utilizing NASCET criteria, using the distal internal carotid diameter as the denominator. RADIATION DOSE REDUCTION: This exam was performed according to the departmental dose-optimization program which includes automated exposure control, adjustment of the mA  and/or kV according to patient size and/or use of iterative reconstruction technique. CONTRAST:  75mL OMNIPAQUE IOHEXOL 350 MG/ML SOLN COMPARISON:  None Available. FINDINGS: CTA NECK FINDINGS Aortic arch: Great vessel origins are patent without significant stenosis. Aortic atherosclerosis. Right carotid system: No evidence of dissection, stenosis (50% or greater), or occlusion. Left carotid system: No evidence of dissection, stenosis (50% or greater), or occlusion. Vertebral arteries: Codominant. No evidence of dissection, stenosis (50% or greater), or occlusion. Skeleton: No acute abnormality on limited assessment. Other neck: No acute abnormality on limited assessment. Upper chest: Visualized lung apices are clear. Review of the MIP images confirms the above findings CTA HEAD FINDINGS Anterior circulation: Bilateral intracranial ICAs, MCAs, and ACAs are patent without proximal hemodynamically significant stenosis. Posterior circulation: Bilateral intradural vertebral arteries, basilar artery and bilateral posterior cerebral arteries are patent without proximal hemodynamically significant stenosis. Venous sinuses: As permitted by contrast timing, patent. Review of the MIP images confirms the above findings IMPRESSION: No emergent large vessel occlusion or proximal hemodynamically significant stenosis. Electronically Signed   By: Feliberto Harts M.D.   On: 11/21/2023 00:56   CT HEAD WO CONTRAST Result Date: 11/20/2023 CLINICAL DATA:  Neuro deficit, acute, stroke suspected 83 y/o female. - c/o vertigo like sx since Friday AM, progressively worsening EXAM: CT HEAD WITHOUT CONTRAST TECHNIQUE: Contiguous axial images were obtained from the base of the skull through the vertex without intravenous contrast. RADIATION DOSE REDUCTION: This exam was performed according to the departmental dose-optimization program which includes automated exposure control, adjustment of the mA and/or kV according to  patient size and/or  use of iterative reconstruction technique. COMPARISON:  None Available. FINDINGS: Brain: Patchy and confluent areas of decreased attenuation are noted throughout the deep and periventricular white matter of the cerebral hemispheres bilaterally, compatible with chronic microvascular ischemic disease. Left cerebellar hypodensity, but high density than the cerebrospinal fluid, with associated loss of gray-white matter differentiation. Chronic left basal ganglia lacunar infarction. No parenchymal hemorrhage. No mass lesion. No extra-axial collection. No mass effect or midline shift. No hydrocephalus. Basilar cisterns are patent. Vascular: No hyperdense vessel. Skull: No acute fracture or focal lesion. Sinuses/Orbits: Paranasal sinuses and mastoid air cells are clear. Bilateral lens replacement. Otherwise the orbits are unremarkable. Other: None. 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 Consider correlation with MRI noncontrast. Electronically Signed   By: Tish Frederickson M.D.   On: 11/20/2023 20:17    Vitals:   11/21/23 0200 11/21/23 0225 11/21/23 0354 11/21/23 0758  BP: (!) 149/89  (!) 182/101 (!) 162/110  Pulse: 78  63 82  Resp: 17  15 18   Temp:  97.8 F (36.6 C) 97.6 F (36.4 C) (!) 97.5 F (36.4 C)  TempSrc:  Oral Oral Oral  SpO2: 95%  96% 95%  Weight:      Height:         PHYSICAL EXAM General:  Alert, well-nourished, well-developed pleasant elderly Caucasian lady in no acute distress Psych:  Mood and affect appropriate for situation CV: Regular rate and rhythm on monitor Respiratory:  Regular, unlabored respirations on room air GI: Abdomen soft and nontender   NEURO:  Mental Status: AA&Ox3, patient is able to give clear and coherent history Speech/Language: speech is without dysarthria or aphasia.  Naming, repetition, fluency, and comprehension intact.  Cranial Nerves:  II: PERRL. Visual fields full.  III, IV, VI: EOMI. mild saccadic dysmetria on right  lateral gaze.  No nystagmus eyelids elevate symmetrically.  V: Sensation is intact to light touch and symmetrical to face.  VII: Face is symmetrical resting and smiling VIII: hearing intact to voice. IX, X: Palate elevates symmetrically. Phonation is normal.  ZO:XWRUEAVW shrug 5/5. XII: tongue is midline without fasciculations. Motor: 5/5 strength to all muscle groups tested.  Tone: is normal and bulk is normal Sensation- Intact to light touch bilaterally. Extinction absent to light touch to DSS.   Coordination: Mild left finger-to-nose ataxia.  HKS: no ataxia in BLE.No drift.  Gait- deferred  Most Recent NIH 1     ASSESSMENT/PLAN  Ms. Kiara Zamora is a 83 y.o. female with hx of HTN, HLD, OSA, arthritis who presents with persistent vertigo since Friday. CT head concerning for a subacute appearing L cerebellar stroke.    Acute Ischemic Infarct:  bilateral cerebellum   Etiology: Embolic from cryptogenic source Code Stroke  CT head subacute left cerebellar infarct  ASPECTS 10.  CTA head & neck no large vessel stenosis or occlusion MRI acute left cerebellar PICA distribution moderate-sized stroke and small middle cerebellar acute infarct 2D Echo Ejection fraction 55 to 60%.  Left atrial size normal.   LDL 143 HgbA1c 5.4 VTE prophylaxis - lovenox No antithrombotic prior to admission, now on aspirin 81 mg daily and clopidogrel 75 mg daily for 3 weeks and then aspirin alone. Therapy recommendations: Pending Disposition: Pending  Hypertension Home meds:  norvasc 10mg , coreg 12.5mg , prinzide Stable Blood Pressure Goal: BP less than 220/110   Hyperlipidemia Home meds:  none LDL 143, goal < 70 Add  Zetia due to statin allergy listed Continue statin at  discharge  Diabetes type   Home meds: None HgbA1c 5.4, goal < 7.0 CBGs SSI Recommend close follow-up with PCP for better DM control  Other Stroke Risk Factors Obesity, Body mass index is 32.7 kg/m., BMI >/= 30 associated  with increased stroke risk, recommend weight loss, diet and exercise as appropriate  Obstructive sleep apnea   Other Active Problems   Hospital day # 0   Pt seen by Neuro NP/APP and later by MD. Note/plan to be edited by MD as needed.    Lynnae January, DNP, AGACNP-BC [facility] Please use AMION for contact information & EPIC for messaging.  I have personally obtained history,examined this patient, reviewed notes, independently viewed imaging studies, participated in medical decision making and plan of care.ROS completed by me personally and pertinent positives fully documented  I have made any additions or clarifications directly to the above note. Agree with note above.  Patient presented with several days of dizziness and gait imbalance due to bilateral (right cerebellar infarcts likely of cryptogenic etiology.  Recommend  30-day heart monitor at discharge for paroxysmal A-fib..   Continue cardiac telemetry monitoring.  She will need prolonged cardiac monitoring at discharge recommend 30-day heart monitor.  Recommend dual antiplatelet therapy aspirin Plavix for 3 weeks followed by aspirin alone and aggressive risk factor modifications.  Patient is unable to tolerate statin and Zetia due to allergy would recommend Leqvio if approved by insurance.  Long discussion with patient and son and daughter at the bedside and answered questions.  Discussed with Dr. Randol Kern.  Greater than 50% time during this 50-minute visit was spent in counseling and coordination of care and discussion patient care team and answering questions.  Follow-up as an outpatient stroke clinic in 2 months.  Stroke team will sign off.  Kindly call for questions  Delia Heady, MD Medical Director [facility] Pager: [phone] 11/21/2023 3:11 PM   To contact Stroke Continuity provider, please refer to [email or web address]. After hours, contact General Neurology

## 2023-11-21 NOTE — Progress Notes (Signed)
 OT Cancellation Note  Patient Details Name: Kiara Zamora MRN: [id number] DOB: [birth year]   Cancelled Treatment:    Reason Eval/Treat Not Completed: OT screened, Per PT no needs identified, will sign off. Please feel free to re consult should function of patient change.   Evern Bio Geofrey Silliman 11/21/2023, 10:09 AM  Nyoka Cowden OTR/L Acute Rehabilitation Services Office: [phone]

## 2023-11-21 NOTE — Evaluation (Signed)
 Speech Language Pathology Evaluation Patient Details Name: Kiara Zamora MRN: [id number] DOB: 05-26-1941 Today's Date: 11/21/2023 Time: 4098-1191 SLP Time Calculation (min) (ACUTE ONLY): 17 min  Problem List:  Patient Active Problem List   Diagnosis Date Noted   HTN (hypertension) 11/21/2023   HLD (hyperlipidemia) 11/21/2023   Acute ischemic stroke (HCC) 11/20/2023   Dyspnea 02/07/2014   Overweight 02/07/2014   Past Medical History:  Past Medical History:  Diagnosis Date   Arthritis    Cancer (HCC)    Hyperlipidemia    Hypertension    Sleep apnea    CPAP   Past Surgical History:  Past Surgical History:  Procedure Laterality Date   CHOLECYSTECTOMY     ESOPHAGOGASTRODUODENOSCOPY  06/05/2017   OOPHORECTOMY     UMBILICAL HERNIA REPAIR N/A 12/09/2021   Procedure: OPEN UMBILICAL HERNIA WITH MESH;  Surgeon: Kinsinger, De Blanch, MD;  Location: [facility];  Service: General;  Laterality: N/A;   VAGINAL HYSTERECTOMY     HPI:  This is a 83 year old female presented with intractable dizziness with N/V.  CT 3/24: "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"  Pt with past medical history of HTN, HLD, vertigo.   Assessment / Plan / Recommendation Clinical Impression  Pt presents with cognitive linguistic abilities within the normal range.  Pt was asssessed using the COGNISTAT (see below for additional information) and she performed within the average range on all subtests administered.  Visuospatial construction was assessed using a clock drawing with number placed in sequential order around circle, properly spaced with correct time noted.  Pt feels that she is at her cognitive baseline and she has no further ST needs. SLP will sign off.  COGNISTAT: All subtests are within the average range, except where otherwise specified.  Orientation:  12/12 Attention: 7/8 Comprehension: 6/6 Repetition: 12/12 Naming: 8/8 Construction: clock drawing, 100% accurate Memory:  10/12 Calculations: 4/4 Similarities: 7/8 Judgment: 5/6     SLP Assessment  SLP Recommendation/Assessment: Patient does not need any further Speech Lanaguage Pathology Services SLP Visit Diagnosis: Cognitive communication deficit (R41.841)    Recommendations for follow up therapy are one component of a multi-disciplinary discharge planning process, led by the attending physician.  Recommendations may be updated based on patient status, additional functional criteria and insurance authorization.    Follow Up Recommendations  No SLP follow up    Assistance Recommended at Discharge  None  Functional Status Assessment Patient has not had a recent decline in their functional status  Frequency and Duration   N/A        SLP Evaluation Cognition  Overall Cognitive Status: Within Functional Limits for tasks assessed Arousal/Alertness: Awake/alert Orientation Level: Oriented X4 Year: 2025 Month: March Day of Week: Correct Attention: Focused;Sustained Focused Attention: Appears intact Sustained Attention: Appears intact Memory: Appears intact Awareness: Appears intact Problem Solving: Appears intact Executive Function: Reasoning;Organizing Reasoning: Appears intact Organizing: Appears intact       Comprehension  Auditory Comprehension Overall Auditory Comprehension: Appears within functional limits for tasks assessed Commands: Within Functional Limits Conversation: Complex Visual Recognition/Discrimination Discrimination: Not tested Reading Comprehension Reading Status: Not tested    Expression Expression Primary Mode of Expression: Verbal Verbal Expression Overall Verbal Expression: Appears within functional limits for tasks assessed Initiation: No impairment Level of Generative/Spontaneous Verbalization: Conversation Repetition: No impairment Naming: No impairment Pragmatics: No impairment Written Expression Dominant Hand: Right Written Expression: Not tested    Oral / Motor  Motor Speech Overall Motor Speech: Appears within functional limits for tasks assessed  Respiration: Within functional limits Phonation: Normal Resonance: Within functional limits Articulation: Within functional limitis Intelligibility: Intelligible Motor Planning: Witnin functional limits Motor Speech Errors: Not applicable            Kerrie Pleasure, MA, CCC-SLP Acute Rehabilitation Services Office: [phone] 11/21/2023, 9:38 AM

## 2023-11-21 NOTE — ED Notes (Signed)
 [facility] called for transport.

## 2023-11-21 NOTE — Plan of Care (Signed)

## 2023-11-21 NOTE — Progress Notes (Addendum)
 PROGRESS NOTE    Kiara Zamora  ZOX:[id number] DOB: 13-Oct-1940 DOA: 11/20/2023 PCP: Irven Coe, MD    Chief Complaint  Patient presents with   Dizziness    Brief Narrative:  This is no charge note as patient was seen and admitted earlier today by Dr. Joneen Roach, patient was seen and examined, labs, imaging were reviewed  This is a 83 year old female with past medical history of HTN, HLD, vertigo.  On Friday morning she developed dizziness.  She had vertigo before but it never lingered more than the day.  This time it would go away.  She additionally had nausea, vomiting.  She was taking over-the-counter meclizine every 6 hours without improvement.  She finally went to [facility].   In the ER patient's presenting vitals BP 167/107.  Potassium 3.2.  CT head late subacute to chronic left cerebral infarction.  CTA head and neck, shows no large vessel occlusion.  Neurology consulted.  Admission requested, MRI confirms acute CVA, please see discussion below  Assessment & Plan:   Principal Problem:   Acute ischemic stroke (HCC) Active Problems:   HTN (hypertension)   HLD (hyperlipidemia)  Acute CVA -MRI brain confirms acute CVA.Moderate-to-large acute infarct within the left cerebellar hemisphere (PICA territory). Occlusion versus severe near occlusive stenosis of the proximal left posterior inferior cerebellar artery noted, Small-to-moderate-sized acute infarct within the medial right cerebellar hemisphere. -Continue with dual antiplatelet therapy, aspirin and Plavix, will await final recommendation from neurology reguarding length of treatment -PT/OT/SLP consulted -LDL is elevated at 143, see discussion below -2D echo is pending -Will need 30 days monitor as discussed with neurology, discussed with cardiology coordinator, they will arrange on discharge.  Hypertension -Allow for permissive hypertension  Hyperlipidemia -LDL is elevated at 143, he has allergy to statin  and Zetia -Neurology will evaluate if she a candidate for Leqvio    DVT prophylaxis: Lovenox Code Status: Full code Family Communication: Husband at bedside Disposition:      Consultants:  Neurology   Subjective:  She does report nausea, but no vomiting  Objective: Vitals:   11/21/23 0225 11/21/23 0354 11/21/23 0758 11/21/23 1247  BP:  (!) 182/101 (!) 162/110 (!) 159/106  Pulse:  63 82 71  Resp:  15 18 18   Temp: 97.8 F (36.6 C) 97.6 F (36.4 C) (!) 97.5 F (36.4 C) (!) 97.5 F (36.4 C)  TempSrc: Oral Oral Oral Oral  SpO2:  96% 95% 95%  Weight:      Height:        Intake/Output Summary (Last 24 hours) at 11/21/2023 1343 Last data filed at 11/21/2023 0531 Gross per 24 hour  Intake 267.04 ml  Output --  Net 267.04 ml   Filed Weights   11/20/23 1806  Weight: 89.1 kg    Examination:  Awake Alert, Oriented X 3 Symmetrical Chest wall movement, Good air movement bilaterally, CTAB RRR,No Gallops,Rubs or new Murmurs, No Parasternal Heave +ve B.Sounds, Abd Soft, No tenderness, No rebound - guarding or rigidity. No Cyanosis, Clubbing or edema, No new Rash or bruise      Data Reviewed: I have personally reviewed following labs and imaging studies  CBC: Recent Labs  Lab 11/20/23 1810  WBC 8.6  HGB 13.6  HCT 40.2  MCV 89.9  PLT 339    Basic Metabolic Panel: Recent Labs  Lab 11/20/23 1810 11/21/23 0440  NA 136 140  K 3.2* 3.6  CL 100 108  CO2 29 26  GLUCOSE 126* 90  BUN 9 5*  CREATININE 0.60 0.57  CALCIUM 10.0 9.5    GFR: Estimated Creatinine Clearance: 61.8 mL/min (by C-G formula based on SCr of 0.57 mg/dL).  Liver Function Tests: Recent Labs  Lab 11/21/23 0440  AST 17  ALT 12  ALKPHOS 67  BILITOT 0.6  PROT 6.6  ALBUMIN 3.5    CBG: Recent Labs  Lab 11/20/23 1808  GLUCAP 119*     No results found for this or any previous visit (from the past 240 hours).       Radiology Studies: MR BRAIN WO CONTRAST Result Date:  11/21/2023 CLINICAL DATA:  CVA. EXAM: MRI HEAD WITHOUT CONTRAST TECHNIQUE: Multiplanar, multiecho pulse sequences of the brain and surrounding structures were obtained without intravenous contrast. COMPARISON:  Non-contrast head CT and CT angiogram head/neck 11/20/2023. FINDINGS: Intermittently motion degraded examination (with up to moderate motion degradation of the acquired sequences). Within this limitation, findings are as follows. Brain: Mild generalized cerebral atrophy. Moderate-to-large acute infarct within the inferior and posterior aspects of the left cerebellar hemisphere (PICA territory). No posterior fossa mass effect at this time. Small to moderate-sized acute infarct within the medial right cerebellar hemisphere. Multifocal T2 FLAIR hyperintense signal abnormality within the cerebral white matter, nonspecific but compatible with moderate chronic small vessel ischemic disease. Several chronic microhemorrhages scattered within the supratentorial brain (with a deep gray nuclei predominance) and cerebellum. No evidence of an intracranial mass. No extra-axial fluid collection. No midline shift. Vascular: Occlusion versus severe near occlusive stenosis of the proximal left posterior inferior cerebellar artery noted on the CTA head/neck of 11/20/2023. Flow voids preserved elsewhere within the proximal large arterial vessels. Skull and upper cervical spine: No focal worrisome marrow lesion. Sinuses/Orbits: No mass or acute finding within the imaged orbits. Prior bilateral ocular lens replacement. No significant paranasal sinus disease. Impressions #2 and #3 will be called to the ordering clinician or representative by the Radiologist Assistant, and communication documented in the PACS or [employer]. IMPRESSION: 1. Intermittently motion degraded examination. 2. Moderate-to-large acute infarct within the left cerebellar hemisphere (PICA territory). No posterior fossa mass effect at this time. Occlusion  versus severe near occlusive stenosis of the proximal left posterior inferior cerebellar artery noted on yesterday's CTA head/neck. 3. Small-to-moderate-sized acute infarct within the medial right cerebellar hemisphere. 4. Moderate chronic small vessel ischemic changes within the cerebral white matter. 5. Several chronic microhemorrhages within the supratentorial and infratentorial brain. 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 6. Mild generalized cerebral atrophy. Electronically Signed   By: Jackey Loge D.O.   On: 11/21/2023 11:58   CT ANGIO HEAD NECK W WO CM Result Date: 11/21/2023 CLINICAL DATA:  Stroke/TIA, determine embolic source EXAM: CT ANGIOGRAPHY HEAD AND NECK WITH AND WITHOUT CONTRAST TECHNIQUE: Multidetector CT imaging of the head and neck was performed using the standard protocol during bolus administration of intravenous contrast. Multiplanar CT image reconstructions and MIPs were obtained to evaluate the vascular anatomy. Carotid stenosis measurements (when applicable) are obtained utilizing NASCET criteria, using the distal internal carotid diameter as the denominator. RADIATION DOSE REDUCTION: This exam was performed according to the departmental dose-optimization program which includes automated exposure control, adjustment of the mA and/or kV according to patient size and/or use of iterative reconstruction technique. CONTRAST:  75mL OMNIPAQUE IOHEXOL 350 MG/ML SOLN COMPARISON:  None Available. FINDINGS: CTA NECK FINDINGS Aortic arch: Great vessel origins are patent without significant stenosis. Aortic atherosclerosis. Right carotid system: No evidence of dissection, stenosis (50% or  greater), or occlusion. Left carotid system: No evidence of dissection, stenosis (50% or greater), or occlusion. Vertebral arteries: Codominant. No evidence of dissection, stenosis (50% or greater), or occlusion. Skeleton: No acute abnormality on limited  assessment. Other neck: No acute abnormality on limited assessment. Upper chest: Visualized lung apices are clear. Review of the MIP images confirms the above findings CTA HEAD FINDINGS Anterior circulation: Bilateral intracranial ICAs, MCAs, and ACAs are patent without proximal hemodynamically significant stenosis. Posterior circulation: Bilateral intradural vertebral arteries, basilar artery and bilateral posterior cerebral arteries are patent without proximal hemodynamically significant stenosis. Venous sinuses: As permitted by contrast timing, patent. Review of the MIP images confirms the above findings IMPRESSION: No emergent large vessel occlusion or proximal hemodynamically significant stenosis. Electronically Signed   By: Feliberto Harts M.D.   On: 11/21/2023 00:56   CT HEAD WO CONTRAST Result Date: 11/20/2023 CLINICAL DATA:  Neuro deficit, acute, stroke suspected 83 y/o female. - c/o vertigo like sx since Friday AM, progressively worsening EXAM: CT HEAD WITHOUT CONTRAST TECHNIQUE: Contiguous axial images were obtained from the base of the skull through the vertex without intravenous contrast. RADIATION DOSE REDUCTION: This exam was performed according to the departmental dose-optimization program which includes automated exposure control, adjustment of the mA and/or kV according to patient size and/or use of iterative reconstruction technique. COMPARISON:  None Available. FINDINGS: Brain: Patchy and confluent areas of decreased attenuation are noted throughout the deep and periventricular white matter of the cerebral hemispheres bilaterally, compatible with chronic microvascular ischemic disease. Left cerebellar hypodensity, but high density than the cerebrospinal fluid, with associated loss of gray-white matter differentiation. Chronic left basal ganglia lacunar infarction. No parenchymal hemorrhage. No mass lesion. No extra-axial collection. No mass effect or midline shift. No hydrocephalus. Basilar  cisterns are patent. Vascular: No hyperdense vessel. Skull: No acute fracture or focal lesion. Sinuses/Orbits: Paranasal sinuses and mastoid air cells are clear. Bilateral lens replacement. Otherwise the orbits are unremarkable. Other: None. 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 Consider correlation with MRI noncontrast. Electronically Signed   By: Tish Frederickson M.D.   On: 11/20/2023 20:17        Scheduled Meds:  [START ON 11/22/2023]  stroke: early stages of recovery book   Does not apply Once   aspirin EC  81 mg Oral Daily   clopidogrel  75 mg Oral Daily   enoxaparin (LOVENOX) injection  40 mg Subcutaneous Daily   Continuous Infusions:  sodium chloride 40 mL/hr at 11/21/23 0531     LOS: 0 days        Huey Bienenstock, MD [facility]   To contact the attending provider between 7A-7P or the covering provider during after hours 7P-7A, please log into the web site [email or web address] and access using universal  password for that web site. If you do not have the password, please call the hospital operator.  11/21/2023, 1:43 PM

## 2023-11-21 NOTE — Evaluation (Signed)
 Physical Therapy Evaluation Patient Details Name: Kiara Zamora MRN: [id number] DOB: 1940/10/02 Today's Date: 11/21/2023  History of Present Illness  Pt is an 83 y.o. female who presented to the ED 3/24 with c/o dizziness that began Friday (3/21). CT revealed L subacute cerebellar infarct. MRI pending. PMH: HTN, HLD, vertigo, fall with L5 fx (April 2024)  Clinical Impression  Pt admitted with above diagnosis. PTA pt lived at home with husband, independent and driving. Pt currently with functional limitations due to the deficits listed below (see PT Problem List). On eval, she demo mod I bed mobility. CGA transfers and amb 150' pushing IV pole. Guarded, cautious gait with wide BOS. No LOB noted. Pt limiting trunk/head movement to help control nausea. BUE/LE strength symmetrical and intact. Pt will benefit from acute skilled PT to increase their independence and safety with mobility to allow discharge. Upon d/c, pt would benefit from OPPT.           If plan is discharge home, recommend the following: A little help with walking and/or transfers;A little help with bathing/dressing/bathroom;Help with stairs or ramp for entrance;Assist for transportation;Assistance with cooking/housework   Can travel by private Manufacturing systems engineer walker (2 wheels) (assess for possible RW)  Recommendations for Other Services       Functional Status Assessment Patient has had a recent decline in their functional status and demonstrates the ability to make significant improvements in function in a reasonable and predictable amount of time.     Precautions / Restrictions Precautions Precautions: Fall Recall of Precautions/Restrictions: Intact      Mobility  Bed Mobility Overal bed mobility: Modified Independent             General bed mobility comments: HOB elevated, increased time    Transfers Overall transfer level: Needs assistance Equipment used: Ambulation  equipment used Transfers: Sit to/from Stand Sit to Stand: Contact guard assist                Ambulation/Gait Ambulation/Gait assistance: Contact guard assist Gait Distance (Feet): 150 Feet Assistive device: IV Pole Gait Pattern/deviations: Step-through pattern, Decreased stride length, Wide base of support Gait velocity: decreased Gait velocity interpretation: <1.31 ft/sec, indicative of household ambulator   General Gait Details: guarded, cautious gait; no LOB. Minimal head/trunk movement trying to avoid worsening nausea.  Stairs            Wheelchair Mobility     Tilt Bed    Modified Rankin (Stroke Patients Only) Modified Rankin (Stroke Patients Only) Pre-Morbid Rankin Score: No symptoms Modified Rankin: Moderately severe disability     Balance Overall balance assessment: Needs assistance Sitting-balance support: No upper extremity supported, Feet supported Sitting balance-Leahy Scale: Good     Standing balance support: Single extremity supported, No upper extremity supported, During functional activity Standing balance-Leahy Scale: Fair                               Pertinent Vitals/Pain Pain Assessment Pain Assessment: No/denies pain    Home Living Family/patient expects to be discharged to:: Private residence Living Arrangements: Spouse/significant other Available Help at Discharge: Family;Available 24 hours/day Type of Home: House Home Access: Stairs to enter   [employer] of Steps: 1 (threshold)   Home Layout: One level Home Equipment: None Additional Comments: reports she plans to get a shower chair    Prior Function Prior Level of Function :  Independent/Modified Independent;Driving             Mobility Comments: no AD ADLs Comments: orders groceries online     Extremity/Trunk Assessment   Upper Extremity Assessment Upper Extremity Assessment: Overall WFL for tasks assessed    Lower Extremity  Assessment Lower Extremity Assessment: Overall WFL for tasks assessed    Cervical / Trunk Assessment Cervical / Trunk Assessment: Normal  Communication   Communication Communication: No apparent difficulties    Cognition Arousal: Alert Behavior During Therapy: WFL for tasks assessed/performed   PT - Cognitive impairments: No apparent impairments                         Following commands: Intact       Cueing       General Comments General comments (skin integrity, edema, etc.): Pt's primary c/o is nausea.    Exercises     Assessment/Plan    PT Assessment Patient needs continued PT services  PT Problem List Decreased balance;Decreased mobility;Decreased activity tolerance       PT Treatment Interventions DME instruction;Functional mobility training;Balance training;Patient/family education;Gait training;Therapeutic activities;Stair training;Therapeutic exercise    PT Goals (Current goals can be found in the Care Plan section)  Acute Rehab PT Goals Patient Stated Goal: get the nausea to subside PT Goal Formulation: With patient Time For Goal Achievement: 12/05/23 Potential to Achieve Goals: Good    Frequency Min 2X/week     Co-evaluation               AM-PAC PT "6 Clicks" Mobility  Outcome Measure Help needed turning from your back to your side while in a flat bed without using bedrails?: None Help needed moving from lying on your back to sitting on the side of a flat bed without using bedrails?: None Help needed moving to and from a bed to a chair (including a wheelchair)?: A Little Help needed standing up from a chair using your arms (e.g., wheelchair or bedside chair)?: A Little Help needed to walk in hospital room?: A Little Help needed climbing 3-5 steps with a railing? : A Little 6 Click Score: 20    End of Session Equipment Utilized During Treatment: Gait belt Activity Tolerance: Patient tolerated treatment well Patient left: in  bed;with call bell/phone within reach;with family/visitor present Nurse Communication: Mobility status PT Visit Diagnosis: Unsteadiness on feet (R26.81);Difficulty in walking, not elsewhere classified (R26.2)    Time: 1610-9604 PT Time Calculation (min) (ACUTE ONLY): 19 min   Charges:   PT Evaluation $PT Eval Moderate Complexity: 1 Mod   PT General Charges $$ ACUTE PT VISIT: 1 Visit         Ferd Glassing., PT  Office # [phone]   Ilda Foil 11/21/2023, 8:58 AM

## 2023-11-21 NOTE — Consult Note (Signed)
 NEUROLOGY CONSULT NOTE   Date of service: November 21, 2023 Patient Name: Kiara Zamora MRN:  [id number] DOB:  1940/12/02 Chief Complaint: "vertigo" Requesting Provider: Angie Fava, DO  History of Present Illness  Kiara Zamora is a 82 y.o. female with hx of HTN, HLD, OSA, arthritis who presents with persistent vertigo since Friday. She went to bed on thursay night and woke up Friday morning with this. This was different than her prior episodes of vertigo as they would go away or let go with OTC motion sickness medication. This one was persistent. She decided to come to the ED for further evaluation and workup.  She had CT Head w/o contrast which was concerning for a subacute appearing left cerebellar stroke.  LKW: 11/17/23 Modified rankin score: 0-Completely asymptomatic and back to baseline post- stroke IV Thrombolysis: not offered, outside window EVT: not offered, outside window  NIHSS components Score: Comment  1a Level of Conscious 0[x]  1[]  2[]  3[]      1b LOC Questions 0[x]  1[]  2[]       1c LOC Commands 0[x]  1[]  2[]       2 Best Gaze 0[x]  1[]  2[]       3 Visual 0[x]  1[]  2[]  3[]      4 Facial Palsy 0[x]  1[]  2[]  3[]      5a Motor Arm - left 0[x]  1[]  2[]  3[]  4[]  UN[]    5b Motor Arm - Right 0[x]  1[]  2[]  3[]  4[]  UN[]    6a Motor Leg - Left 0[x]  1[]  2[]  3[]  4[]  UN[]    6b Motor Leg - Right 0[x]  1[]  2[]  3[]  4[]  UN[]    7 Limb Ataxia 0[x]  1[]  2[]  3[]  UN[]     8 Sensory 0[x]  1[]  2[]  UN[]      9 Best Language 0[x]  1[]  2[]  3[]      10 Dysarthria 0[x]  1[]  2[]  UN[]      11 Extinct. and Inattention 0[x]  1[]  2[]       TOTAL: 0      ROS  Comprehensive ROS performed and pertinent positives documented in HPI   Past History   Past Medical History:  Diagnosis Date   Arthritis    Cancer (HCC)    Hyperlipidemia    Hypertension    Sleep apnea    CPAP    Past Surgical History:  Procedure Laterality Date   CHOLECYSTECTOMY     ESOPHAGOGASTRODUODENOSCOPY  06/05/2017   OOPHORECTOMY      UMBILICAL HERNIA REPAIR N/A 12/09/2021   Procedure: OPEN UMBILICAL HERNIA WITH MESH;  Surgeon: Kinsinger, De Blanch, MD;  Location: [facility];  Service: General;  Laterality: N/A;   VAGINAL HYSTERECTOMY      Family History: Family History  Problem Relation Age of Onset   Hypertension Mother    Esophageal cancer Mother    Colon cancer Father     Social History  reports that she has never smoked. She has never used smokeless tobacco. She reports that she does not drink alcohol and does not use drugs.  Allergies  Allergen Reactions   Ezetimibe Shortness Of Breath   Welchol [Colesevelam] Shortness Of Breath   Actonel [Risedronate] Other (See Comments)    Throat tightness   Atorvastatin Other (See Comments)    Joint pain/muscle aches   Pravastatin Other (See Comments)   Red Yeast Rice Extract Other (See Comments)    Joint pain/muscle aches    Sudafed [Pseudoephedrine Hcl] Itching    Severe itching   Penicillins Rash    RASH Has patient had a PCN reaction causing  immediate rash, facial/tongue/throat swelling, SOB or lightheadedness with hypotension: Yes Has patient had a PCN reaction causing severe rash involving mucus membranes or skin necrosis: yES Has patient had a PCN reaction that required hospitalization: nO Has patient had a PCN reaction occurring within the last 10 years: yES If all of the above answers are "NO", then may proceed with Cephalosporin use.     Medications   Current Facility-Administered Medications:    [START ON 11/22/2023]  stroke: early stages of recovery book, , Does not apply, Once, Crosley, Debby, MD   0.9 %  sodium chloride infusion, , Intravenous, Continuous, Crosley, Debby, MD, Last Rate: 40 mL/hr at 11/21/23 0450, New Bag at 11/21/23 0450   acetaminophen (TYLENOL) tablet 650 mg, 650 mg, Oral, Q4H PRN **OR** acetaminophen (TYLENOL) 160 MG/5ML solution 650 mg, 650 mg, Per Tube, Q4H PRN **OR** acetaminophen (TYLENOL) suppository 650 mg, 650 mg, Rectal,  Q4H PRN, Crosley, Debby, MD   aspirin EC tablet 81 mg, 81 mg, Oral, Daily, Crosley, Debby, MD, 81 mg at 11/21/23 0448   clopidogrel (PLAVIX) tablet 75 mg, 75 mg, Oral, Daily, Crosley, Debby, MD, 75 mg at 11/21/23 0448   enoxaparin (LOVENOX) injection 40 mg, 40 mg, Subcutaneous, Daily, Crosley, Debby, MD   LORazepam (ATIVAN) injection 1 mg, 1 mg, Intravenous, Once, Crosley, Debby, MD   senna-docusate (Senokot-S) tablet 1 tablet, 1 tablet, Oral, QHS PRN, Gery Pray, MD  Vitals   Vitals:   11/21/23 0137 11/21/23 0200 11/21/23 0225 11/21/23 0354  BP: 133/89 (!) 149/89  (!) 182/101  Pulse: 85 78  63  Resp: 17 17  15   Temp:   97.8 F (36.6 C) 97.6 F (36.4 C)  TempSrc:   Oral Oral  SpO2: 92% 95%  96%  Weight:      Height:        Body mass index is 32.7 kg/m.  Physical Exam   General: Laying comfortably in bed; in no acute distress.  HENT: Normal oropharynx and mucosa. Normal external appearance of ears and nose.  Neck: Supple, no pain or tenderness  CV: No JVD. No peripheral edema.  Pulmonary: Symmetric Chest rise. Normal respiratory effort.  Abdomen: Soft to touch, non-tender.  Ext: No cyanosis, edema, or deformity  Skin: No rash. Normal palpation of skin.   Musculoskeletal: Normal digits and nails by inspection. No clubbing.   Neurologic Examination  Mental status/Cognition: Alert, oriented to self, place, month and year, good attention.  Speech/language: Fluent, comprehension intact, object naming intact, repetition intact.  Cranial nerves:   CN II Pupils equal and reactive to light, no VF deficits    CN III,IV,VI EOM intact, no gaze preference or deviation, no nystagmus    CN V normal sensation in V1, V2, and V3 segments bilaterally    CN VII no asymmetry, no nasolabial fold flattening    CN VIII normal hearing to speech    CN IX & X normal palatal elevation, no uvular deviation    CN XI 5/5 head turn and 5/5 shoulder shrug bilaterally    CN XII midline tongue  protrusion   Motor:  Muscle bulk: normal, tone normal, pronator drift none tremor none Mvmt Root Nerve  Muscle Right Left Comments  SA C5/6 Ax Deltoid 5 5   EF C5/6 Mc Biceps 5 5   EE C6/7/8 Rad Triceps 5 5   WF C6/7 Med FCR     WE C7/8 PIN ECU     F Ab C8/T1 U ADM/FDI 5 5  HF L1/2/3 Fem Illopsoas 5 5   KE L2/3/4 Fem Quad 5 5   DF L4/5 D Peron Tib Ant 5 5   PF S1/2 Tibial Grc/Sol 5 5    Sensation:  Light touch Intact throughout   Pin prick    Temperature    Vibration   Proprioception    Coordination/Complex Motor:  - Finger to Nose intact BL - Heel to shin intact BL - Rapid alternating movement are normal - Gait: deferred.   Labs/Imaging/Neurodiagnostic studies   CBC:  Recent Labs  Lab December 20, 2023 1810  WBC 8.6  HGB 13.6  HCT 40.2  MCV 89.9  PLT 339   Basic Metabolic Panel:  Lab Results  Component Value Date   NA 136 20-Dec-2023   K 3.2 (L) 12-20-23   CO2 29 12/20/2023   GLUCOSE 126 (H) 12/20/23   BUN 9 12/20/23   CREATININE 0.60 12-20-2023   CALCIUM 10.0 12-20-23   GFRNONAA >60 20-Dec-2023   Lipid Panel: No results found for: "LDLCALC" HgbA1c: No results found for: "HGBA1C" Urine Drug Screen: No results found for: "LABOPIA", "COCAINSCRNUR", "LABBENZ", "AMPHETMU", "THCU", "LABBARB"  Alcohol Level No results found for: "ETH" INR No results found for: "INR" APTT No results found for: "APTT" AED levels: No results found for: "PHENYTOIN", "ZONISAMIDE", "LAMOTRIGINE", "LEVETIRACETA"  CT Head without contrast(Personally reviewed): 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 Consider correlation with MRI noncontrast.  CT angio Head and Neck with contrast(Personally reviewed): No LVO  MRI Brain: pending   ASSESSMENT   Kiara Zamora is a 83 y.o. female with hx of HTN, HLD, OSA, arthritis who presents with persistent vertigo since Friday. CT head concerning for a subacute appearing L cerebellar stroke.  RECOMMENDATIONS  - Frequent  Neuro checks per stroke unit protocol - Recommend brain imaging with MRI Brain without contrast - Recommend obtaining TTE  - Recommend obtaining Lipid panel with LDL - Please start statin if LDL > 70 - Recommend HbA1c to evaluate for diabetes and how well it is controlled. - Antithrombotic - Aspirin 81mg  daily along with plavix 75mg  daily x 21 days, followed by Aspirin 81mg  daily alone. - Recommend DVT ppx - SBP goal - permissive hypertension first 24 h < 220/110. Held home meds.  - Recommend Telemetry monitoring for arrythmia - Recommend bedside swallow screen prior to PO intake. - Stroke education booklet - Recommend PT/OT/SLP consult  ______________________________________________________________________    Signed, Erick Blinks, MD [facility]

## 2023-11-21 NOTE — Care Management Obs Status (Signed)
 MEDICARE OBSERVATION STATUS NOTIFICATION   Patient Details  Name: Kiara Zamora MRN: [id number] Date of Birth: February 13, 1941   Medicare Observation Status Notification Given:  Yes    Gordy Clement, RN 11/21/2023, 3:16 PM

## 2023-11-21 NOTE — Progress Notes (Signed)
 Hospitalist Transfer Note:    Nursing staff, Please call [facility] Admits & Consults System-Wide number on Amion [phone]) as soon as patient's arrival, so appropriate admitting provider can evaluate the pt.   Transferring facility: [facility] Requesting provider: Dr. Criss Alvine (EDP at [facility]) Reason for transfer: admission for further evaluation and management of potential subacute cerebellar infarct.    83 year old female with history of essential hypertension, recurrent vertigo, who presented to [facility] ED complaining of persistent vertigo started on Friday, 11/17/2023.  While she notes a history of recurrent vertigo, reports that these episodes of vertigo have typically been self-limited.  However, her vertigo starting on Friday, 11/17/2023 has been persistent, representing a change in duration relative to baseline experiences with vertigo, prompting her to present to [facility] for further evaluation.  She notes that her vertigo has been refractory to meclizine.  Vital signs in the ED were notable for the following: Elevated systolic blood pressures in the 180s.  Imaging notable for CT head which was suggestive of subacute cerebellar infarct.  EDP has discussed patient's case with on-call neurology, Dr. Derry Lory, who conveys that neuro will formally consult at [facility].  Subsequently, I accepted this patient for transfer for observation to a med-tele bed at [facility] for further work-up and management of the above.      Newton Pigg, DO Hospitalist

## 2023-11-21 NOTE — H&P (Signed)
 PCP:   Irven Coe, MD   Chief Complaint:  Dizziness  HPI: This is a 83 year old female with past medical history of HTN, HLD, vertigo.  On Friday morning she developed dizziness.  She had vertigo before but it never lingered more than the day.  This time it would go away.  She additionally had nausea, vomiting.  She was taking over-the-counter meclizine every 6 hours without improvement.  She finally went to [facility] ER.  In the ER patient's presenting vitals BP 167/107.  Potassium 3.2.  CT head late subacute to chronic left cerebral infarction.  CTA head and neck, shows no large vessel occlusion.  Neurology consulted.  Admission requested  Review of Systems:  Per HPI  Past Medical History: Past Medical History:  Diagnosis Date   Arthritis    Cancer (HCC)    Hyperlipidemia    Hypertension    Sleep apnea    CPAP   Past Surgical History:  Procedure Laterality Date   CHOLECYSTECTOMY     ESOPHAGOGASTRODUODENOSCOPY  06/05/2017   OOPHORECTOMY     UMBILICAL HERNIA REPAIR N/A 12/09/2021   Procedure: OPEN UMBILICAL HERNIA WITH MESH;  Surgeon: Kinsinger, De Blanch, MD;  Location: [facility];  Service: General;  Laterality: N/A;   VAGINAL HYSTERECTOMY      Medications: Prior to Admission medications   Medication Sig Start Date End Date Taking? Authorizing Provider  acetaminophen (TYLENOL) 500 MG tablet Take 1,000 mg by mouth at bedtime as needed (arthritis pain).    [provider]  amLODipine (NORVASC) 10 MG tablet Take 10 mg by mouth every morning. 01/21/14   [provider]  carvedilol (COREG) 6.25 MG tablet Take 12.5 mg by mouth every morning. 01/21/14   [provider]  Cholecalciferol (VITAMIN D-3) 125 MCG (5000 UT) TABS Take 5,000 Units by mouth every morning.    [provider]  Cyanocobalamin (VITAMIN B-12) 5000 MCG TBDP Take 5,000 mcg by mouth every morning.    [provider]  lisinopril-hydrochlorothiazide  (PRINZIDE,ZESTORETIC) 20-25 MG per tablet Take 1 tablet by mouth every morning. 1 TAB DAILY 01/21/14   [provider]  Menthol, Topical Analgesic, (BIOFREEZE EX) Apply 1 application. topically daily as needed (knee pain).    [provider]  omeprazole (PRILOSEC OTC) 20 MG tablet Take 40 mg by mouth at bedtime.    [provider]  oxyCODONE (OXY IR/ROXICODONE) 5 MG immediate release tablet Take 1 tablet (5 mg total) by mouth every 6 (six) hours as needed for severe pain. 12/09/21   Kinsinger, De Blanch, MD  oxyCODONE-acetaminophen (PERCOCET/ROXICET) 5-325 MG tablet Take 1 tablet by mouth every 6 (six) hours as needed for severe pain. 12/23/22   Jeanelle Malling, PA  pantoprazole (PROTONIX) 40 MG tablet Take 40 mg by mouth every morning. 05/02/17   [provider]  POTASSIUM GLUCONATE PO Take 1 tablet by mouth every morning.    [provider]  PRESCRIPTION MEDICATION Inhale into the lungs at bedtime. CPAP    [provider]  solifenacin (VESICARE) 10 MG tablet Take 10 mg by mouth every morning.    [provider]    Allergies:   Allergies  Allergen Reactions   Ezetimibe Shortness Of Breath   Welchol [Colesevelam] Shortness Of Breath   Actonel [Risedronate] Other (See Comments)    Throat tightness   Atorvastatin Other (See Comments)    Joint pain/muscle aches   Pravastatin Other (See Comments)   Red Yeast Rice Extract Other (See Comments)  Joint pain/muscle aches    Sudafed [Pseudoephedrine Hcl] Itching    Severe itching   Penicillins Rash    RASH Has patient had a PCN reaction causing immediate rash, facial/tongue/throat swelling, SOB or lightheadedness with hypotension: Yes Has patient had a PCN reaction causing severe rash involving mucus membranes or skin necrosis: yES Has patient had a PCN reaction that required hospitalization: nO Has patient had a PCN reaction occurring within the last 10 years: yES If all of the above  answers are "NO", then may proceed with Cephalosporin use.     Social History:  reports that she has never smoked. She has never used smokeless tobacco. She reports that she does not drink alcohol and does not use drugs.  Family History: Family History  Problem Relation Age of Onset   Hypertension Mother    Esophageal cancer Mother    Colon cancer Father     Physical Exam: Vitals:   11/20/23 2230 11/21/23 0137 11/21/23 0200 11/21/23 0225  BP: (!) 184/104 133/89 (!) 149/89   Pulse: 91 85 78   Resp: 17 17 17    Temp:    97.8 F (36.6 C)  TempSrc:    Oral  SpO2: 97% 92% 95%   Weight:      Height:        General:  A&O x3, well developed and nourished, no acute distress Eyes: Pink conjunctiva, no scleral icterus ENT: Moist oral mucosa, neck supple, no thyromegaly Lungs: CTA B/L, no wheeze, no use of accessory muscles Cardiovascular: RRR, no murmurs. No carotid bruits, no JVD Abdomen: soft, positive BS, NTND,  GU: not examined Neuro: CN II - XII grossly intact, sensation intact Musculoskeletal: strength 5/5 all extremities, no edema Skin: no rash, no subcutaneous crepitation, no decubitus Psych: appropriate patient  Labs on Admission:  Recent Labs    11/20/23 1810  NA 136  K 3.2*  CL 100  CO2 29  GLUCOSE 126*  BUN 9  CREATININE 0.60  CALCIUM 10.0    Recent Labs    11/20/23 1810  WBC 8.6  HGB 13.6  HCT 40.2  MCV 89.9  PLT 339    Radiological Exams on Admission: CT ANGIO HEAD NECK W WO CM Result Date: 11/21/2023 CLINICAL DATA:  Stroke/TIA, determine embolic source EXAM: CT ANGIOGRAPHY HEAD AND NECK WITH AND WITHOUT CONTRAST TECHNIQUE: Multidetector CT imaging of the head and neck was performed using the standard protocol during bolus administration of intravenous contrast. Multiplanar CT image reconstructions and MIPs were obtained to evaluate the vascular anatomy. Carotid stenosis measurements (when applicable) are obtained utilizing NASCET criteria, using  the distal internal carotid diameter as the denominator. RADIATION DOSE REDUCTION: This exam was performed according to the departmental dose-optimization program which includes automated exposure control, adjustment of the mA and/or kV according to patient size and/or use of iterative reconstruction technique. CONTRAST:  75mL OMNIPAQUE IOHEXOL 350 MG/ML SOLN COMPARISON:  None Available. FINDINGS: CTA NECK FINDINGS Aortic arch: Great vessel origins are patent without significant stenosis. Aortic atherosclerosis. Right carotid system: No evidence of dissection, stenosis (50% or greater), or occlusion. Left carotid system: No evidence of dissection, stenosis (50% or greater), or occlusion. Vertebral arteries: Codominant. No evidence of dissection, stenosis (50% or greater), or occlusion. Skeleton: No acute abnormality on limited assessment. Other neck: No acute abnormality on limited assessment. Upper chest: Visualized lung apices are clear. Review of the MIP images confirms the above findings CTA HEAD FINDINGS Anterior circulation: Bilateral intracranial ICAs, MCAs, and ACAs are patent  without proximal hemodynamically significant stenosis. Posterior circulation: Bilateral intradural vertebral arteries, basilar artery and bilateral posterior cerebral arteries are patent without proximal hemodynamically significant stenosis. Venous sinuses: As permitted by contrast timing, patent. Review of the MIP images confirms the above findings IMPRESSION: No emergent large vessel occlusion or proximal hemodynamically significant stenosis. Electronically Signed   By: Feliberto Harts M.D.   On: 11/21/2023 00:56   CT HEAD WO CONTRAST Result Date: 11/20/2023 CLINICAL DATA:  Neuro deficit, acute, stroke suspected 83 y/o female. - c/o vertigo like sx since Friday AM, progressively worsening EXAM: CT HEAD WITHOUT CONTRAST TECHNIQUE: Contiguous axial images were obtained from the base of the skull through the vertex without  intravenous contrast. RADIATION DOSE REDUCTION: This exam was performed according to the departmental dose-optimization program which includes automated exposure control, adjustment of the mA and/or kV according to patient size and/or use of iterative reconstruction technique. COMPARISON:  None Available. FINDINGS: Brain: Patchy and confluent areas of decreased attenuation are noted throughout the deep and periventricular white matter of the cerebral hemispheres bilaterally, compatible with chronic microvascular ischemic disease. Left cerebellar hypodensity, but high density than the cerebrospinal fluid, with associated loss of gray-white matter differentiation. Chronic left basal ganglia lacunar infarction. No parenchymal hemorrhage. No mass lesion. No extra-axial collection. No mass effect or midline shift. No hydrocephalus. Basilar cisterns are patent. Vascular: No hyperdense vessel. Skull: No acute fracture or focal lesion. Sinuses/Orbits: Paranasal sinuses and mastoid air cells are clear. Bilateral lens replacement. Otherwise the orbits are unremarkable. Other: None. 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 Consider correlation with MRI noncontrast. Electronically Signed   By: Tish Frederickson M.D.   On: 11/20/2023 20:17    Assessment/Plan Present on Admission:  Acute ischemic stroke [facility]) -Likely occurred on Friday.  TIA order set initiated -MRI brain, 2D echo -ASA 81 mg, Plavix 5 mg daily initiated -Permissive hypertension < 220/110  -PT/OT/swallow eval. -Neurochecks every 2 hours -Lipid panel ordered   HTN //  HLD -Per TIA order set  Kamori Barbier 11/21/2023, 3:50 AM

## 2023-11-22 ENCOUNTER — Other Ambulatory Visit (HOSPITAL_COMMUNITY): Payer: Self-pay

## 2023-11-22 ENCOUNTER — Other Ambulatory Visit: Payer: Self-pay | Admitting: Neurology

## 2023-11-22 DIAGNOSIS — E785 Hyperlipidemia, unspecified: Secondary | ICD-10-CM

## 2023-11-22 DIAGNOSIS — I639 Cerebral infarction, unspecified: Secondary | ICD-10-CM | POA: Diagnosis not present

## 2023-11-22 DIAGNOSIS — I10 Essential (primary) hypertension: Secondary | ICD-10-CM | POA: Diagnosis not present

## 2023-11-22 LAB — BASIC METABOLIC PANEL
Anion gap: 9 (ref 5–15)
BUN: 8 mg/dL (ref 8–23)
CO2: 22 mmol/L (ref 22–32)
Calcium: 8.9 mg/dL (ref 8.9–10.3)
Chloride: 105 mmol/L (ref 98–111)
Creatinine, Ser: 0.62 mg/dL (ref 0.44–1.00)
GFR, Estimated: >60 mL/min (ref >60)
Glucose, Bld: 93 mg/dL (ref 70–99)
Potassium: 3.4 mmol/L — ABNORMAL LOW (ref 3.5–5.1)
Sodium: 136 mmol/L (ref 135–145)

## 2023-11-22 LAB — CBC
HCT: 35.8 % — ABNORMAL LOW (ref 36.0–46.0)
Hemoglobin: 11.9 g/dL — ABNORMAL LOW (ref 12.0–15.0)
MCH: 30.2 pg (ref 26.0–34.0)
MCHC: 33.2 g/dL (ref 30.0–36.0)
MCV: 90.9 fL (ref 80.0–100.0)
Platelets: 282 10*3/uL (ref 150–400)
RBC: 3.94 MIL/uL (ref 3.87–5.11)
RDW: 12.5 % (ref 11.5–15.5)
WBC: 6.4 10*3/uL (ref 4.0–10.5)
nRBC: 0 % (ref 0.0–0.2)

## 2023-11-22 MED ORDER — POTASSIUM CHLORIDE CRYS ER 20 MEQ PO TBCR: 40.0000 meq | EXTENDED_RELEASE_TABLET | Freq: Once | ORAL | Status: DC

## 2023-11-22 MED ORDER — ASPIRIN 81 MG PO TBEC
81.0000 mg | DELAYED_RELEASE_TABLET | Freq: Every day | ORAL | 12 refills | Status: AC
  Filled 2023-11-22: qty 30, 30d supply, fill #0

## 2023-11-22 MED ORDER — CLOPIDOGREL BISULFATE 75 MG PO TABS
75.0000 mg | ORAL_TABLET | Freq: Every day | ORAL | 0 refills | Status: DC
  Filled 2023-11-22: qty 21, 21d supply, fill #0

## 2023-11-22 MED ORDER — PANTOPRAZOLE SODIUM 40 MG PO TBEC
40.0000 mg | DELAYED_RELEASE_TABLET | Freq: Two times a day (BID) | ORAL | 1 refills | Status: DC
  Filled 2023-11-22: qty 60, 30d supply, fill #0

## 2023-11-22 NOTE — Plan of Care (Signed)

## 2023-11-22 NOTE — Discharge Summary (Signed)
 PATIENT DETAILS Name: Kiara Zamora Age: 83 y.o. Sex: female Date of Birth: [birth year] MRN: [id number]. Admitting Physician: Angie Fava, DO [id number], Theone Murdoch, MD  Admit Date: 11/20/2023 Discharge date: 11/22/2023  Recommendations for Outpatient Follow-up:  Follow up with PCP in 1-2 weeks Please obtain CMP/CBC in one week Please ensure follow up with Neurology/Stroke Clinic  Admitted From:  Home  Disposition: Home   Discharge Condition: good  CODE STATUS:   Code Status: Full Code   Diet recommendation:  Diet Order             Diet - low sodium heart healthy           Diet Heart Room service appropriate? Yes; Fluid consistency: Thin  Diet effective now                    Brief Summary: 83 year old female with history of HTN, HLD-intolerant to statin-presented with vertigo-she was found to have acute CVA.  Significant studies 3/24>> CTA head/neck: No LVO/hemodynamically significant stenosis 3/25>> MRI brain: Moderate to large infarct left cerebral hemisphere.  Small to moderate-sized acute infarct right cerebellar hemisphere. 3/25>> TTE: EF 55-60%, grade 1 diastolic dysfunction. 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 Please ensure follow-up with neurology on discharge  HTN Initially permissive hypertension was allowed Resume all antihypertensives as previous on discharge.  Class 1 Obesity: Estimated body mass index is 32.7 kg/m as calculated  from the following:   Height as of this encounter: 5\' 5"  (1.651 m).   Weight as of this encounter: 89.1 kg.    Discharge Diagnoses:  Principal Problem:   Acute ischemic stroke [facility]) Active Problems:   HTN (hypertension)   HLD (hyperlipidemia)   Discharge Instructions:  Activity:  As tolerated   Discharge Instructions     Ambulatory referral to Neurology   Complete by: As directed    An appointment is requested in approximately: 8 weeks   Call MD for:  extreme fatigue   Complete by: As directed    Call MD for:  persistant dizziness or light-headedness   Complete by: As directed    Diet - low sodium heart healthy   Complete by: As directed    Discharge instructions   Complete by: As directed    Follow with Primary MD  Irven Coe, MD in 1-2 weeks  Please get a complete blood count and chemistry panel checked by your Primary MD at your next visit, and again as instructed by your Primary MD.  Get Medicines reviewed and adjusted: Please take all your medications with you for your next visit with your Primary MD  Laboratory/radiological data: Please request your Primary MD to go over all hospital tests and procedure/radiological results at the follow up, please ask your Primary MD to get all Hospital records sent to his/her office.  In some cases, they will be blood work, cultures and biopsy results pending at the time of your discharge. Please request that your primary care M.D. follows up on these results.  Also Note the following: If you experience worsening of your admission  symptoms, develop shortness of breath, life threatening emergency, suicidal or homicidal thoughts you must seek medical attention immediately by calling 911 or calling your MD immediately  if symptoms less severe.  You must read complete instructions/literature along with all the possible adverse reactions/side effects for all the Medicines you take and that have been prescribed to you. Take any new  Medicines after you have completely understood and accpet all the possible adverse reactions/side effects.   Do not drive when taking Pain medications or sleeping medications (Benzodaizepines)  Do not take more than prescribed Pain, Sleep and Anxiety Medications. It is not advisable to combine anxiety,sleep and pain medications without talking with your primary care practitioner  Special Instructions: If you have smoked or chewed Tobacco  in the last 2 yrs please stop smoking, stop any regular Alcohol  and or any Recreational drug use.  Wear Seat belts while driving.  Please note: You were cared for by a hospitalist during your hospital stay. Once you are discharged, your primary care physician will handle any further medical issues. Please note that NO REFILLS for any discharge medications will be authorized once you are discharged, as it is imperative that you return to your primary care physician (or establish a relationship with a primary care physician if you do not have one) for your post hospital discharge needs so that they can reassess your need for medications and monitor your lab values.   Increase activity slowly   Complete by: As directed       Allergies as of 11/22/2023       Reactions   Ezetimibe Shortness Of Breath   Welchol [colesevelam] Shortness Of Breath   Actonel [risedronate] Other (See Comments)   Throat tightness   Atorvastatin Other (See Comments)   Joint pain/muscle aches   Pravastatin Other (See Comments)   Red Yeast Rice Extract Other (See Comments)   Joint pain/muscle aches   Sudafed [pseudoephedrine Hcl] Itching   Severe itching   Penicillins Rash        Medication List     STOP taking these medications    omeprazole 40 MG capsule Commonly known as: PRILOSEC       TAKE these medications    acetaminophen 500 MG tablet Commonly known as: TYLENOL Take 1,000 mg by mouth every 6 (six) hours as needed (arthritis pain).   amLODipine 10 MG  tablet Commonly known as: NORVASC Take 10 mg by mouth every morning.   aspirin EC 81 MG tablet Take 1 tablet (81 mg total) by mouth daily. Swallow whole.   BIOFREEZE EX Apply 1 application. topically daily as needed (knee pain).   carvedilol 6.25 MG tablet Commonly known as: COREG Take 6.25 mg by mouth in the morning and at bedtime.   clopidogrel 75 MG tablet Commonly known as: PLAVIX Take 1 tablet (75 mg total) by mouth daily.   lisinopril 40 MG tablet Commonly known as: ZESTRIL Take 40 mg by mouth daily.   meclizine 25 MG tablet Commonly known as: ANTIVERT Take 25 mg by mouth 3 (three) times daily as needed for dizziness.   MULTIVITAL PO Take 1 tablet by mouth daily.   pantoprazole 40 MG tablet Commonly known as: Protonix Take 1 tablet (40 mg total) by mouth 2 (two) times daily.   PRESCRIPTION MEDICATION Inhale into the lungs at bedtime. CPAP   solifenacin 10 MG tablet Commonly known as: VESICARE Take 10 mg by mouth every morning.   Vitamin B-12 5000 MCG Tbdp Take 5,000  mcg by mouth every morning.   Vitamin D-3 125 MCG (5000 UT) Tabs Take 5,000 Units by mouth every morning.        Follow-up Information     Irven Coe, MD. Schedule an appointment as soon as possible for a visit in 1 week(s).   Specialty: Family Medicine Contact information: [address] [place] [address] [phone]         Micki Riley, MD Follow up.   Specialties: Neurology, Radiology Why: Office will call with date/time, If you dont hear from them,please give them a call Contact information: [address] [place] [address] [phone]                Allergies  Allergen Reactions   Ezetimibe Shortness Of Breath   Welchol [Colesevelam] Shortness Of Breath   Actonel [Risedronate] Other (See Comments)    Throat tightness   Atorvastatin Other (See Comments)    Joint pain/muscle aches   Pravastatin Other (See Comments)    Red Yeast Rice Extract Other (See Comments)    Joint pain/muscle aches    Sudafed [Pseudoephedrine Hcl] Itching    Severe itching   Penicillins Rash     Other Procedures/Studies: ECHOCARDIOGRAM COMPLETE Result Date: 11/21/2023    ECHOCARDIOGRAM REPORT   Patient Name:   KANAYA GUNNARSON Date of Exam: 11/21/2023 Medical Rec #:  [id number]        Height:       65.0 in Accession #:    [id number]       Weight:       196.5 lb Date of Birth:  [birth year]        BSA:          1.963 m Patient Age:    82 years         BP:           159/106 mmHg Patient Gender: F                HR:           64 bpm. Exam Location:  Inpatient Procedure: 2D Echo, Cardiac Doppler and Color Doppler (Both Spectral and Color            Flow Doppler were utilized during procedure). Indications:    TIA  History:        Patient has no prior history of Echocardiogram examinations.                 Stroke; Risk Factors:Hypertension.  Sonographer:    Lamont Snowball Referring Phys: [phone] DEBBY CROSLEY IMPRESSIONS  1. Left ventricular ejection fraction, by estimation, is 55 to 60%. The left ventricle has normal function. The left ventricle has no regional wall motion abnormalities. There is mild concentric left ventricular hypertrophy. Left ventricular diastolic parameters are consistent with Grade I diastolic dysfunction (impaired relaxation).  2. Right ventricular systolic function is normal. The right ventricular size is normal.  3. The mitral valve is normal in structure. No evidence of mitral valve regurgitation. No evidence of mitral stenosis.  4. The aortic valve is tricuspid. Aortic valve regurgitation is not visualized. No aortic stenosis is present.  5. The inferior vena cava is normal in size with greater than 50% respiratory variability, suggesting right atrial pressure of 3 mmHg. Comparison(s): No prior Echocardiogram. FINDINGS  Left Ventricle: No strain or 3D evaluation performed. Left ventricular ejection fraction, by estimation, is 55  to 60%. The left ventricle has normal  function. The left ventricle has no regional wall motion abnormalities. Strain was performed and the global longitudinal strain is indeterminate. The left ventricular internal cavity size was normal in size. There is mild concentric left ventricular hypertrophy. Left ventricular diastolic parameters are consistent with Grade I diastolic dysfunction (impaired relaxation). Right Ventricle: The right ventricular size is normal. No increase in right ventricular wall thickness. Right ventricular systolic function is normal. Left Atrium: Left atrial size was normal in size. Right Atrium: Right atrial size was normal in size. Pericardium: There is no evidence of pericardial effusion. Mitral Valve: The mitral valve is normal in structure. No evidence of mitral valve regurgitation. No evidence of mitral valve stenosis. MV peak gradient, 6.2 mmHg. The mean mitral valve gradient is 3.0 mmHg. Tricuspid Valve: The tricuspid valve is normal in structure. Tricuspid valve regurgitation is mild . No evidence of tricuspid stenosis. Aortic Valve: The aortic valve is tricuspid. Aortic valve regurgitation is not visualized. No aortic stenosis is present. Aortic valve peak gradient measures 6.9 mmHg. Pulmonic Valve: The pulmonic valve was normal in structure. Pulmonic valve regurgitation is trivial. No evidence of pulmonic stenosis. Aorta: The aortic root and ascending aorta are structurally normal, with no evidence of dilitation. Venous: The inferior vena cava is normal in size with greater than 50% respiratory variability, suggesting right atrial pressure of 3 mmHg. IAS/Shunts: No atrial level shunt detected by color flow Doppler. Additional Comments: 3D was performed not requiring image post processing on an independent workstation and was indeterminate.  LEFT VENTRICLE PLAX 2D LVIDd:         4.70 cm   Diastology LVIDs:         3.40 cm   LV e' medial:    7.51 cm/s LV PW:         1.20 cm   LV E/e'  medial:  10.9 LV IVS:        1.10 cm   LV e' lateral:   6.85 cm/s LVOT diam:     2.10 cm   LV E/e' lateral: 11.9 LV SV:         69 LV SV Index:   35 LVOT Area:     3.46 cm  RIGHT VENTRICLE             IVC RV Basal diam:  3.80 cm     IVC diam: 2.00 cm RV S prime:     13.30 cm/s TAPSE (M-mode): 2.5 cm LEFT ATRIUM             Index        RIGHT ATRIUM           Index LA diam:        4.10 cm 2.09 cm/m   RA Area:     17.20 cm LA Vol (A2C):   45.7 ml 23.28 ml/m  RA Volume:   47.00 ml  23.94 ml/m LA Vol (A4C):   51.8 ml 26.38 ml/m LA Biplane Vol: 49.4 ml 25.16 ml/m  AORTIC VALVE AV Area (Vmax): 2.44 cm AV Vmax:        131.00 cm/s AV Peak Grad:   6.9 mmHg LVOT Vmax:      92.20 cm/s LVOT Vmean:     69.600 cm/s LVOT VTI:       0.198 m  AORTA Ao Root diam: 3.20 cm Ao Asc diam:  3.80 cm MITRAL VALVE                TRICUSPID VALVE MV  Area (PHT): 2.22 cm     TR Peak grad:   14.9 mmHg MV Area VTI:   2.04 cm     TR Vmax:        193.00 cm/s MV Peak grad:  6.2 mmHg MV Mean grad:  3.0 mmHg     SHUNTS MV Vmax:       1.25 m/s     Systemic VTI:  0.20 m MV Vmean:      79.7 cm/s    Systemic Diam: 2.10 cm MV Decel Time: 342 msec MV E velocity: 81.80 cm/s MV A velocity: 126.00 cm/s MV E/A ratio:  0.65 Riley Lam MD Electronically signed by Riley Lam MD Signature Date/Time: 11/21/2023/3:52:11 PM    Final    MR BRAIN WO CONTRAST Result Date: 11/21/2023 CLINICAL DATA:  CVA. EXAM: MRI HEAD WITHOUT CONTRAST TECHNIQUE: Multiplanar, multiecho pulse sequences of the brain and surrounding structures were obtained without intravenous contrast. COMPARISON:  Non-contrast head CT and CT angiogram head/neck 11/20/2023. FINDINGS: Intermittently motion degraded examination (with up to moderate motion degradation of the acquired sequences). Within this limitation, findings are as follows. Brain: Mild generalized cerebral atrophy. Moderate-to-large acute infarct within the inferior and posterior aspects of the left cerebellar  hemisphere (PICA territory). No posterior fossa mass effect at this time. Small to moderate-sized acute infarct within the medial right cerebellar hemisphere. Multifocal T2 FLAIR hyperintense signal abnormality within the cerebral white matter, nonspecific but compatible with moderate chronic small vessel ischemic disease. Several chronic microhemorrhages scattered within the supratentorial brain (with a deep gray nuclei predominance) and cerebellum. No evidence of an intracranial mass. No extra-axial fluid collection. No midline shift. Vascular: Occlusion versus severe near occlusive stenosis of the proximal left posterior inferior cerebellar artery noted on the CTA head/neck of 11/20/2023. Flow voids preserved elsewhere within the proximal large arterial vessels. Skull and upper cervical spine: No focal worrisome marrow lesion. Sinuses/Orbits: No mass or acute finding within the imaged orbits. Prior bilateral ocular lens replacement. No significant paranasal sinus disease. Impressions #2 and #3 will be called to the ordering clinician or representative by the Radiologist Assistant, and communication documented in the PACS or [employer]. IMPRESSION: 1. Intermittently motion degraded examination. 2. Moderate-to-large acute infarct within the left cerebellar hemisphere (PICA territory). No posterior fossa mass effect at this time. Occlusion versus severe near occlusive stenosis of the proximal left posterior inferior cerebellar artery noted on yesterday's CTA head/neck. 3. Small-to-moderate-sized acute infarct within the medial right cerebellar hemisphere. 4. Moderate chronic small vessel ischemic changes within the cerebral white matter. 5. Several chronic microhemorrhages within the supratentorial and infratentorial brain. 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 6. Mild generalized cerebral atrophy. Electronically Signed   By: Jackey Loge D.O.   On:  11/21/2023 11:58   CT ANGIO HEAD NECK W WO CM Result Date: 11/21/2023 CLINICAL DATA:  Stroke/TIA, determine embolic source EXAM: CT ANGIOGRAPHY HEAD AND NECK WITH AND WITHOUT CONTRAST TECHNIQUE: Multidetector CT imaging of the head and neck was performed using the standard protocol during bolus administration of intravenous contrast. Multiplanar CT image reconstructions and MIPs were obtained to evaluate the vascular anatomy. Carotid stenosis measurements (when applicable) are obtained utilizing NASCET criteria, using the distal internal carotid diameter as the denominator. RADIATION DOSE REDUCTION: This exam was performed according to the departmental dose-optimization program which includes automated exposure control, adjustment of the mA and/or kV according to patient size and/or use of iterative reconstruction technique. CONTRAST:  75mL OMNIPAQUE IOHEXOL  350 MG/ML SOLN COMPARISON:  None Available. FINDINGS: CTA NECK FINDINGS Aortic arch: Great vessel origins are patent without significant stenosis. Aortic atherosclerosis. Right carotid system: No evidence of dissection, stenosis (50% or greater), or occlusion. Left carotid system: No evidence of dissection, stenosis (50% or greater), or occlusion. Vertebral arteries: Codominant. No evidence of dissection, stenosis (50% or greater), or occlusion. Skeleton: No acute abnormality on limited assessment. Other neck: No acute abnormality on limited assessment. Upper chest: Visualized lung apices are clear. Review of the MIP images confirms the above findings CTA HEAD FINDINGS Anterior circulation: Bilateral intracranial ICAs, MCAs, and ACAs are patent without proximal hemodynamically significant stenosis. Posterior circulation: Bilateral intradural vertebral arteries, basilar artery and bilateral posterior cerebral arteries are patent without proximal hemodynamically significant stenosis. Venous sinuses: As permitted by contrast timing, patent. Review of the MIP  images confirms the above findings IMPRESSION: No emergent large vessel occlusion or proximal hemodynamically significant stenosis. Electronically Signed   By: Feliberto Harts M.D.   On: 11/21/2023 00:56   CT HEAD WO CONTRAST Result Date: 11/20/2023 CLINICAL DATA:  Neuro deficit, acute, stroke suspected 83 y/o female. - c/o vertigo like sx since Friday AM, progressively worsening EXAM: CT HEAD WITHOUT CONTRAST TECHNIQUE: Contiguous axial images were obtained from the base of the skull through the vertex without intravenous contrast. RADIATION DOSE REDUCTION: This exam was performed according to the departmental dose-optimization program which includes automated exposure control, adjustment of the mA and/or kV according to patient size and/or use of iterative reconstruction technique. COMPARISON:  None Available. FINDINGS: Brain: Patchy and confluent areas of decreased attenuation are noted throughout the deep and periventricular white matter of the cerebral hemispheres bilaterally, compatible with chronic microvascular ischemic disease. Left cerebellar hypodensity, but high density than the cerebrospinal fluid, with associated loss of gray-white matter differentiation. Chronic left basal ganglia lacunar infarction. No parenchymal hemorrhage. No mass lesion. No extra-axial collection. No mass effect or midline shift. No hydrocephalus. Basilar cisterns are patent. Vascular: No hyperdense vessel. Skull: No acute fracture or focal lesion. Sinuses/Orbits: Paranasal sinuses and mastoid air cells are clear. Bilateral lens replacement. Otherwise the orbits are unremarkable. Other: None. 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 Consider correlation with MRI noncontrast. Electronically Signed   By: Tish Frederickson M.D.   On: 11/20/2023 20:17     TODAY-DAY OF DISCHARGE:  Subjective:   Kiara Zamora today has no headache,no chest abdominal pain,no new weakness tingling or numbness, feels  much better wants to go home today.   Objective:   Blood pressure (!) 157/82, pulse 71, temperature 98 F (36.7 C), temperature source Oral, resp. rate 18, height 5\' 5"  (1.651 m), weight 89.1 kg, SpO2 92%.  Intake/Output Summary (Last 24 hours) at 11/22/2023 0811 Last data filed at 11/22/2023 0715 Gross per 24 hour  Intake 839.42 ml  Output --  Net 839.42 ml   Filed Weights   11/20/23 1806  Weight: 89.1 kg    Exam: Awake Alert, Oriented *3, No new F.N deficits, Normal affect [place].AT,PERRAL Supple Neck,No JVD, No cervical lymphadenopathy appriciated.  Symmetrical Chest wall movement, Good air movement bilaterally, CTAB RRR,No Gallops,Rubs or new Murmurs, No Parasternal Heave +ve B.Sounds, Abd Soft, Non tender, No organomegaly appriciated, No rebound -guarding or rigidity. No Cyanosis, Clubbing or edema, No new Rash or bruise   PERTINENT RADIOLOGIC STUDIES: ECHOCARDIOGRAM COMPLETE Result Date: 11/21/2023    ECHOCARDIOGRAM REPORT   Patient Name:   MALLOREY ODONELL Date of Exam: 11/21/2023 Medical Rec #:  [id number]  Height:       65.0 in Accession #:    [id number]       Weight:       196.5 lb Date of Birth:  March 19, 1941        BSA:          1.963 m Patient Age:    82 years         BP:           159/106 mmHg Patient Gender: F                HR:           64 bpm. Exam Location:  Inpatient Procedure: 2D Echo, Cardiac Doppler and Color Doppler (Both Spectral and Color            Flow Doppler were utilized during procedure). Indications:    TIA  History:        Patient has no prior history of Echocardiogram examinations.                 Stroke; Risk Factors:Hypertension.  Sonographer:    Lamont Snowball Referring Phys: [phone] DEBBY CROSLEY IMPRESSIONS  1. Left ventricular ejection fraction, by estimation, is 55 to 60%. The left ventricle has normal function. The left ventricle has no regional wall motion abnormalities. There is mild concentric left ventricular hypertrophy. Left ventricular  diastolic parameters are consistent with Grade I diastolic dysfunction (impaired relaxation).  2. Right ventricular systolic function is normal. The right ventricular size is normal.  3. The mitral valve is normal in structure. No evidence of mitral valve regurgitation. No evidence of mitral stenosis.  4. The aortic valve is tricuspid. Aortic valve regurgitation is not visualized. No aortic stenosis is present.  5. The inferior vena cava is normal in size with greater than 50% respiratory variability, suggesting right atrial pressure of 3 mmHg. Comparison(s): No prior Echocardiogram. FINDINGS  Left Ventricle: No strain or 3D evaluation performed. Left ventricular ejection fraction, by estimation, is 55 to 60%. The left ventricle has normal function. The left ventricle has no regional wall motion abnormalities. Strain was performed and the global longitudinal strain is indeterminate. The left ventricular internal cavity size was normal in size. There is mild concentric left ventricular hypertrophy. Left ventricular diastolic parameters are consistent with Grade I diastolic dysfunction (impaired relaxation). Right Ventricle: The right ventricular size is normal. No increase in right ventricular wall thickness. Right ventricular systolic function is normal. Left Atrium: Left atrial size was normal in size. Right Atrium: Right atrial size was normal in size. Pericardium: There is no evidence of pericardial effusion. Mitral Valve: The mitral valve is normal in structure. No evidence of mitral valve regurgitation. No evidence of mitral valve stenosis. MV peak gradient, 6.2 mmHg. The mean mitral valve gradient is 3.0 mmHg. Tricuspid Valve: The tricuspid valve is normal in structure. Tricuspid valve regurgitation is mild . No evidence of tricuspid stenosis. Aortic Valve: The aortic valve is tricuspid. Aortic valve regurgitation is not visualized. No aortic stenosis is present. Aortic valve peak gradient measures 6.9 mmHg.  Pulmonic Valve: The pulmonic valve was normal in structure. Pulmonic valve regurgitation is trivial. No evidence of pulmonic stenosis. Aorta: The aortic root and ascending aorta are structurally normal, with no evidence of dilitation. Venous: The inferior vena cava is normal in size with greater than 50% respiratory variability, suggesting right atrial pressure of 3 mmHg. IAS/Shunts: No atrial level shunt detected by color flow Doppler. Additional Comments: 3D was performed  not requiring image post processing on an independent workstation and was indeterminate.  LEFT VENTRICLE PLAX 2D LVIDd:         4.70 cm   Diastology LVIDs:         3.40 cm   LV e' medial:    7.51 cm/s LV PW:         1.20 cm   LV E/e' medial:  10.9 LV IVS:        1.10 cm   LV e' lateral:   6.85 cm/s LVOT diam:     2.10 cm   LV E/e' lateral: 11.9 LV SV:         69 LV SV Index:   35 LVOT Area:     3.46 cm  RIGHT VENTRICLE             IVC RV Basal diam:  3.80 cm     IVC diam: 2.00 cm RV S prime:     13.30 cm/s TAPSE (M-mode): 2.5 cm LEFT ATRIUM             Index        RIGHT ATRIUM           Index LA diam:        4.10 cm 2.09 cm/m   RA Area:     17.20 cm LA Vol (A2C):   45.7 ml 23.28 ml/m  RA Volume:   47.00 ml  23.94 ml/m LA Vol (A4C):   51.8 ml 26.38 ml/m LA Biplane Vol: 49.4 ml 25.16 ml/m  AORTIC VALVE AV Area (Vmax): 2.44 cm AV Vmax:        131.00 cm/s AV Peak Grad:   6.9 mmHg LVOT Vmax:      92.20 cm/s LVOT Vmean:     69.600 cm/s LVOT VTI:       0.198 m  AORTA Ao Root diam: 3.20 cm Ao Asc diam:  3.80 cm MITRAL VALVE                TRICUSPID VALVE MV Area (PHT): 2.22 cm     TR Peak grad:   14.9 mmHg MV Area VTI:   2.04 cm     TR Vmax:        193.00 cm/s MV Peak grad:  6.2 mmHg MV Mean grad:  3.0 mmHg     SHUNTS MV Vmax:       1.25 m/s     Systemic VTI:  0.20 m MV Vmean:      79.7 cm/s    Systemic Diam: 2.10 cm MV Decel Time: 342 msec MV E velocity: 81.80 cm/s MV A velocity: 126.00 cm/s MV E/A ratio:  0.65 Riley Lam MD  Electronically signed by Riley Lam MD Signature Date/Time: 11/21/2023/3:52:11 PM    Final    MR BRAIN WO CONTRAST Result Date: 11/21/2023 CLINICAL DATA:  CVA. EXAM: MRI HEAD WITHOUT CONTRAST TECHNIQUE: Multiplanar, multiecho pulse sequences of the brain and surrounding structures were obtained without intravenous contrast. COMPARISON:  Non-contrast head CT and CT angiogram head/neck 11/20/2023. FINDINGS: Intermittently motion degraded examination (with up to moderate motion degradation of the acquired sequences). Within this limitation, findings are as follows. Brain: Mild generalized cerebral atrophy. Moderate-to-large acute infarct within the inferior and posterior aspects of the left cerebellar hemisphere (PICA territory). No posterior fossa mass effect at this time. Small to moderate-sized acute infarct within the medial right cerebellar hemisphere. Multifocal T2 FLAIR hyperintense signal abnormality within the cerebral white matter, nonspecific but compatible  with moderate chronic small vessel ischemic disease. Several chronic microhemorrhages scattered within the supratentorial brain (with a deep gray nuclei predominance) and cerebellum. No evidence of an intracranial mass. No extra-axial fluid collection. No midline shift. Vascular: Occlusion versus severe near occlusive stenosis of the proximal left posterior inferior cerebellar artery noted on the CTA head/neck of 11/20/2023. Flow voids preserved elsewhere within the proximal large arterial vessels. Skull and upper cervical spine: No focal worrisome marrow lesion. Sinuses/Orbits: No mass or acute finding within the imaged orbits. Prior bilateral ocular lens replacement. No significant paranasal sinus disease. Impressions #2 and #3 will be called to the ordering clinician or representative by the Radiologist Assistant, and communication documented in the PACS or [employer]. IMPRESSION: 1. Intermittently motion degraded examination. 2.  Moderate-to-large acute infarct within the left cerebellar hemisphere (PICA territory). No posterior fossa mass effect at this time. Occlusion versus severe near occlusive stenosis of the proximal left posterior inferior cerebellar artery noted on yesterday's CTA head/neck. 3. Small-to-moderate-sized acute infarct within the medial right cerebellar hemisphere. 4. Moderate chronic small vessel ischemic changes within the cerebral white matter. 5. Several chronic microhemorrhages within the supratentorial and infratentorial brain. 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 6. Mild generalized cerebral atrophy. Electronically Signed   By: Jackey Loge D.O.   On: 11/21/2023 11:58   CT ANGIO HEAD NECK W WO CM Result Date: 11/21/2023 CLINICAL DATA:  Stroke/TIA, determine embolic source EXAM: CT ANGIOGRAPHY HEAD AND NECK WITH AND WITHOUT CONTRAST TECHNIQUE: Multidetector CT imaging of the head and neck was performed using the standard protocol during bolus administration of intravenous contrast. Multiplanar CT image reconstructions and MIPs were obtained to evaluate the vascular anatomy. Carotid stenosis measurements (when applicable) are obtained utilizing NASCET criteria, using the distal internal carotid diameter as the denominator. RADIATION DOSE REDUCTION: This exam was performed according to the departmental dose-optimization program which includes automated exposure control, adjustment of the mA and/or kV according to patient size and/or use of iterative reconstruction technique. CONTRAST:  75mL OMNIPAQUE IOHEXOL 350 MG/ML SOLN COMPARISON:  None Available. FINDINGS: CTA NECK FINDINGS Aortic arch: Great vessel origins are patent without significant stenosis. Aortic atherosclerosis. Right carotid system: No evidence of dissection, stenosis (50% or greater), or occlusion. Left carotid system: No evidence of dissection, stenosis (50% or greater), or occlusion.  Vertebral arteries: Codominant. No evidence of dissection, stenosis (50% or greater), or occlusion. Skeleton: No acute abnormality on limited assessment. Other neck: No acute abnormality on limited assessment. Upper chest: Visualized lung apices are clear. Review of the MIP images confirms the above findings CTA HEAD FINDINGS Anterior circulation: Bilateral intracranial ICAs, MCAs, and ACAs are patent without proximal hemodynamically significant stenosis. Posterior circulation: Bilateral intradural vertebral arteries, basilar artery and bilateral posterior cerebral arteries are patent without proximal hemodynamically significant stenosis. Venous sinuses: As permitted by contrast timing, patent. Review of the MIP images confirms the above findings IMPRESSION: No emergent large vessel occlusion or proximal hemodynamically significant stenosis. Electronically Signed   By: Feliberto Harts M.D.   On: 11/21/2023 00:56   CT HEAD WO CONTRAST Result Date: 11/20/2023 CLINICAL DATA:  Neuro deficit, acute, stroke suspected 83 y/o female. - c/o vertigo like sx since Friday AM, progressively worsening EXAM: CT HEAD WITHOUT CONTRAST TECHNIQUE: Contiguous axial images were obtained from the base of the skull through the vertex without intravenous contrast. RADIATION DOSE REDUCTION: This exam was performed according to the departmental dose-optimization program which includes automated exposure control, adjustment of the  mA and/or kV according to patient size and/or use of iterative reconstruction technique. COMPARISON:  None Available. FINDINGS: Brain: Patchy and confluent areas of decreased attenuation are noted throughout the deep and periventricular white matter of the cerebral hemispheres bilaterally, compatible with chronic microvascular ischemic disease. Left cerebellar hypodensity, but high density than the cerebrospinal fluid, with associated loss of gray-white matter differentiation. Chronic left basal ganglia lacunar  infarction. No parenchymal hemorrhage. No mass lesion. No extra-axial collection. No mass effect or midline shift. No hydrocephalus. Basilar cisterns are patent. Vascular: No hyperdense vessel. Skull: No acute fracture or focal lesion. Sinuses/Orbits: Paranasal sinuses and mastoid air cells are clear. Bilateral lens replacement. Otherwise the orbits are unremarkable. Other: None. 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 Consider correlation with MRI noncontrast. Electronically Signed   By: Tish Frederickson M.D.   On: 11/20/2023 20:17     PERTINENT LAB RESULTS: CBC: Recent Labs    11/20/23 1810 11/22/23 0426  WBC 8.6 6.4  HGB 13.6 11.9*  HCT 40.2 35.8*  PLT 339 282   CMET CMP     Component Value Date/Time   NA 136 11/22/2023 0426   K 3.4 (L) 11/22/2023 0426   CL 105 11/22/2023 0426   CO2 22 11/22/2023 0426   GLUCOSE 93 11/22/2023 0426   BUN 8 11/22/2023 0426   CREATININE 0.62 11/22/2023 0426   CALCIUM 8.9 11/22/2023 0426   PROT 6.6 11/21/2023 0440   ALBUMIN 3.5 11/21/2023 0440   AST 17 11/21/2023 0440   ALT 12 11/21/2023 0440   ALKPHOS 67 11/21/2023 0440   BILITOT 0.6 11/21/2023 0440   GFRNONAA >60 11/22/2023 0426    GFR Estimated Creatinine Clearance: 61.8 mL/min (by C-G formula based on SCr of 0.62 mg/dL). No results for input(s): "LIPASE", "AMYLASE" in the last 72 hours. No results for input(s): "CKTOTAL", "CKMB", "CKMBINDEX", "TROPONINI" in the last 72 hours. Invalid input(s): "POCBNP" No results for input(s): "DDIMER" in the last 72 hours. Recent Labs    11/21/23 0440  HGBA1C 5.4   Recent Labs    11/21/23 0440  CHOL 212*  HDL 53  LDLCALC 143*  TRIG 81  CHOLHDL 4.0   No results for input(s): "TSH", "T4TOTAL", "T3FREE", "THYROIDAB" in the last 72 hours.  Invalid input(s): "FREET3" No results for input(s): "VITAMINB12", "FOLATE", "FERRITIN", "TIBC", "IRON", "RETICCTPCT" in the last 72 hours. Coags: No results for input(s): "INR" in  the last 72 hours.  Invalid input(s): "PT" Microbiology: No results found for this or any previous visit (from the past 240 hours).  FURTHER DISCHARGE INSTRUCTIONS:  Get Medicines reviewed and adjusted: Please take all your medications with you for your next visit with your Primary MD  Laboratory/radiological data: Please request your Primary MD to go over all hospital tests and procedure/radiological results at the follow up, please ask your Primary MD to get all Hospital records sent to his/her office.  In some cases, they will be blood work, cultures and biopsy results pending at the time of your discharge. Please request that your primary care M.D. goes through all the records of your hospital data and follows up on these results.  Also Note the following: If you experience worsening of your admission symptoms, develop shortness of breath, life threatening emergency, suicidal or homicidal thoughts you must seek medical attention immediately by calling 911 or calling your MD immediately  if symptoms less severe.  You must read complete instructions/literature along with all the possible adverse reactions/side effects for all the Medicines  you take and that have been prescribed to you. Take any new Medicines after you have completely understood and accpet all the possible adverse reactions/side effects.   Do not drive when taking Pain medications or sleeping medications (Benzodaizepines)  Do not take more than prescribed Pain, Sleep and Anxiety Medications. It is not advisable to combine anxiety,sleep and pain medications without talking with your primary care practitioner  Special Instructions: If you have smoked or chewed Tobacco  in the last 2 yrs please stop smoking, stop any regular Alcohol  and or any Recreational drug use.  Wear Seat belts while driving.  Please note: You were cared for by a hospitalist during your hospital stay. Once you are discharged, your primary care  physician will handle any further medical issues. Please note that NO REFILLS for any discharge medications will be authorized once you are discharged, as it is imperative that you return to your primary care physician (or establish a relationship with a primary care physician if you do not have one) for your post hospital discharge needs so that they can reassess your need for medications and monitor your lab values.  Total Time spent coordinating discharge including counseling, education and face to face time equals greater than 30 minutes.  Signed: Jeoffrey Massed 11/22/2023 8:11 AM

## 2023-11-23 ENCOUNTER — Encounter: Payer: Self-pay | Admitting: Neurology

## 2023-11-27 NOTE — Telephone Encounter (Signed)
 Placed call to Pt regarding PA for Leqvio. No answer LVM to call back.

## 2023-11-28 NOTE — Telephone Encounter (Signed)
 Pt called back regarding VM from office yesterday. Made Pt aware her insurance will not cover the Leqvio until she has tried and failed Repatha and Praluent. Discussed w/Pt that PCP typically handles the management of these medications. Pt stated she has an appt tomorrow 4/2 with her PCP and plans on discussing these medications at that appt as well as take her denial letter. Pt stated thankful for call and plans to be at her [facility] upcoming appt in June.

## 2023-11-29 ENCOUNTER — Encounter: Payer: Self-pay | Admitting: *Deleted

## 2023-12-02 ENCOUNTER — Telehealth: Payer: Self-pay | Admitting: Physician Assistant

## 2023-12-02 NOTE — Telephone Encounter (Signed)
 Received event monitor alert from [facility] on this patient. She is not established with our practice but had been requested by internal medicine team/neurology for recent admission for stroke.   Per [employer], the monitor autotriggered for severe sinus bradycardia at 33bpm today at 11:26am EST, bradycardia lasted for 75 seconds.   They also reported at that time a second event at 15:06pm where HR ranged 38-60 for maximum 15 seconds, sinus, no prolonged episodes of bradycardia at that time.  I was able to reach patient who confirmed she has been awake all day but not really having any associated symptoms. She felt a little tired this AM but no palpitations, dizziness, syncope. SBP has ranged from 103-119 systolic, example from today was 119/78. HR has been running 60s each time she checked it today.  I reviewed with Dr. Tenny Craw (DOD) since patient has not established with our practice. She suggested having the patient cut her carvedilol in half to 1/2 tablet BID (3.125mg  BID) and sending a message to our office to expedite ASAP follow-up. (We cannot adjust the formal rx yet at this time since she is not a patient of record for prescribing.) I will route to triage team so they can help schedule on Monday as they will also likely be receiving the faxed report at [facility] as well since Dr. Bjorn Pippin is listed as ordering provider.  Patient aware office will call to move up appointment on Monday. ER precautions also reviewed.

## 2023-12-04 NOTE — Telephone Encounter (Signed)
 Patient identification verified by 2 forms. Marilynn Rail, RN    Called and spoke to patient  Patient states:   -In the morning she feels draggy and tired   -eventually perks up during the day   -has felt like this for 2-3 days in a row   -she is taking Carvedilol 3.125mg  BID  Patient denies:   -lightheadedness/dizziness   -increased fatigue Patient agrees with 4/8 OV with Dr. Herbie Baltimore at 9:20 Am  Reviewed ED warning signs/precautions Patient verbalized understanding, no further questions at this time

## 2023-12-04 NOTE — Telephone Encounter (Signed)
 She apparently had an event monitor but the report is not in the chart.

## 2023-12-05 ENCOUNTER — Telehealth: Payer: Self-pay | Admitting: *Deleted

## 2023-12-05 ENCOUNTER — Telehealth: Payer: Self-pay | Admitting: Cardiology

## 2023-12-05 ENCOUNTER — Ambulatory Visit: Attending: Cardiology | Admitting: Cardiology

## 2023-12-05 ENCOUNTER — Encounter: Payer: Self-pay | Admitting: Cardiology

## 2023-12-05 VITALS — BP 138/88 | HR 45 | Ht 65.0 in | Wt 197.2 lb

## 2023-12-05 DIAGNOSIS — Z8673 Personal history of transient ischemic attack (TIA), and cerebral infarction without residual deficits: Secondary | ICD-10-CM | POA: Diagnosis not present

## 2023-12-05 DIAGNOSIS — R001 Bradycardia, unspecified: Secondary | ICD-10-CM | POA: Insufficient documentation

## 2023-12-05 DIAGNOSIS — I10 Essential (primary) hypertension: Secondary | ICD-10-CM | POA: Diagnosis not present

## 2023-12-05 DIAGNOSIS — E785 Hyperlipidemia, unspecified: Secondary | ICD-10-CM | POA: Diagnosis not present

## 2023-12-05 DIAGNOSIS — I639 Cerebral infarction, unspecified: Secondary | ICD-10-CM

## 2023-12-05 DIAGNOSIS — G4733 Obstructive sleep apnea (adult) (pediatric): Secondary | ICD-10-CM

## 2023-12-05 NOTE — Telephone Encounter (Signed)
 Calling with abn results

## 2023-12-05 NOTE — Patient Instructions (Addendum)
 Medication Instructions:    Continue with current medications *If you need a refill on your cardiac medications before your next appointment, please call your pharmacy*   Lab Work: Not  needed    Testing/Procedures:  Not needed  Follow-Up: At [facility], you and your health needs are our priority.  As part of our continuing mission to provide you with exceptional heart care, we have created designated Provider Care Teams.  These Care Teams include your primary Cardiologist (physician) and Advanced Practice Providers (APPs -  Physician Assistants and Nurse Practitioners) who all work together to provide you with the care you need, when you need it.     Your next appointment:    2 to 3 month(s)  The format for your next appointment:   In Person  Provider:   Bryan Lemma, MD    Other Instructions  Continue wearing your heart monitor.

## 2023-12-05 NOTE — Progress Notes (Unsigned)
 Cardiology Office Note:  .   Date:  12/05/2023  ID:  Kiara Zamora, DOB 01-05-1941, MRN [id number] PCP: Irven Coe, MD  [facility] Providers Cardiologist:  None { Click to update primary MD,subspecialty MD or APP then REFRESH:1}    No chief complaint on file.   Patient Profile: .     Kiara Zamora is a *** 83 y.o. female *** with a PMH notable for HTN, HLD (statin myopathy) and recent CVA who presents here for *** at the request of Irven Coe, MD.  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  Neurology recommended aspirin and Plavix for 3 weeks followed by aspirin alone.  Plan was to set up for 30-day event monitor.  Also was scheduled by neurology to plan for Leqvio injections.  Event monitor in place-we were contacted on 12/02/2023 for an episode of sinus bradycardia at 11:26 AM with heart rate of 33 bpm.  Another event occurred at 3:06 PM with heart rate ranging 38 to 60 bpm for 15 seconds.   Subjective  Discussed the use of AI scribe software for clinical note transcription with the patient, who gave verbal consent to proceed.  History of Present Illness  Cardiovascular ROS: {roscv:310661} pain able to get up and navigate but basically [place] morning person, old and I think after observed a little slow  And just overall heaviness and not really dizzy or pass out no I have that since I was in the hospital and they help me back on the blood pressure medicine just a heaviness that I feel I get into it I am preferably couch or sedentary.  24/7 basically she is getting up and go in doing light walks yeah yeah you what you want stay active right any swelling ice I not noticed any swelling at all when you lay down at night without any sleep at  ROS:  Review of Systems - {ros master:310782}    Objective     Studies Reviewed: .           Echo 11/21/2023: EF 55 to 60%.  No RWMA.  Mild concentric LVH.  GR 1 DD.  Normal RV.  Normal MV.  Normal LV.  Normal RAP.  Not unexpectedly, no etiology for CVA noted.  Risk Assessment/Calculations:               Physical Exam:   VS:  BP 138/88 (BP Location: Left Arm, Patient Position: Sitting)   Pulse (!) 45   Ht 5\' 5"  (1.651 m)   Wt 197 lb 3.2 oz (89.4 kg)   SpO2 96%   BMI 32.82 kg/m    Wt Readings from Last 3 Encounters:  12/05/23 197 lb 3.2 oz (89.4 kg)  11/20/23 196 lb 8 oz (89.1 kg)  12/23/22 207 lb 14.3 oz (94.3 kg)    GEN: Well nourished, well developed in no acute distress; *** NECK: No JVD; No carotid bruits CARDIAC: Normal S1, S2; RRR, no murmurs, rubs, gallops RESPIRATORY:  Clear to auscultation without rales, wheezing or rhonchi ; nonlabored, good air movement. ABDOMEN: Soft, non-tender, non-distended EXTREMITIES:  No edema; No deformity      ASSESSMENT AND PLAN: .    Problem List Items Addressed This Visit       Cardiology Problems   HTN (hypertension) - Primary  Relevant Orders   EKG 12-Lead (Completed)    Assessment and Plan Assessment & Plan      {Are you ordering a CV Procedure (e.g. stress test, cath, DCCV, TEE, etc)?   Press F2        :[id number]}   Follow-Up: No follow-ups on file.  Total time spent: *** min spent with patient + *** min spent charting = *** min    Signed, Marykay Lex, MD, MS Bryan Lemma, M.D., M.S. Interventional Cardiologist  [facility]  Pager # [phone] Phone # [phone] [address] [place], [place] [address]

## 2023-12-05 NOTE — Telephone Encounter (Signed)
 Critical results from monitor. Called and spoke to patient and patient states she feel fine. Patient reports she  a little tired but feels good. Patient reports last blood pressure last night 136/92 and hr 82. Patient verbalized next reading 134/86 and hr 86. Notified DOD, Dr. Herbie Baltimore of critical monitor heart rate reading and reading given to DOD. Patient has an appt scheduled with Dr. Herbie Baltimore this morning and provider will follow up with patient. Advise patient to bring her blood pressure monitor and blood pressure reading to visit and patient verbalized an understanding.

## 2023-12-05 NOTE — Telephone Encounter (Signed)
 Copy of monitor report was reviewed by Dr Herbie Baltimore at office appointment on 12/05/23

## 2023-12-05 NOTE — Telephone Encounter (Signed)
 Called and receive critical result from [facility] with critical reading, heart rate 34 reported and seen on monitor yesterday at 7:33 pm with sinus PVC. Report given to DOD, Dr. Herbie Baltimore. Writer called  patient and patient denied any symptoms. Patient verbalized she has an appointment today with Dr. Herbie Baltimore.

## 2023-12-07 ENCOUNTER — Encounter: Payer: Self-pay | Admitting: Cardiology

## 2023-12-07 DIAGNOSIS — G4733 Obstructive sleep apnea (adult) (pediatric): Secondary | ICD-10-CM | POA: Insufficient documentation

## 2023-12-07 DIAGNOSIS — I495 Sick sinus syndrome: Secondary | ICD-10-CM | POA: Insufficient documentation

## 2023-12-07 NOTE — Assessment & Plan Note (Signed)
 Hypertension managed with multiple antihypertensives. Reduction in carvedilol dosage necessitated addition of HCTZ to maintain blood pressure control. Combination with lisinopril expected to enhance effect. Alternatives like spironolactone considered if needed. - Continue HCTZ 25 mg daily along with lisinopril 40 mg daily and carvedilol reduced to 3.125 mg twice daily - Monitor blood pressure regularly. - Reassess blood pressure control in follow-up appointment.

## 2023-12-07 NOTE — Assessment & Plan Note (Signed)
 Hyperlipidemia with muscle aches from multiple statins. Insurance denied Leqvio; awaiting approval for Repatha. Goal is effective cholesterol management with statin alternatives. - Await insurance approval for Repatha. - Consider Praluent if Repatha is not approved.

## 2023-12-07 NOTE — Assessment & Plan Note (Signed)
 Intermittent bradycardia with heart rates dropping to 45 bpm, occasionally into the 30s, likely exacerbated by carvedilol. No dizziness or syncope, indicating bradycardia is not dangerous. Heaviness and fatigue may relate to bradycardia episodes. Carvedilol dosage reduced to allow heart rate increase, balanced with HCTZ for blood pressure management. - Continue 30-day cardiac monitoring until May 2nd. - Maintain reduced dose of carvedilol at 1/2 tab (3.25 mg) twice daily. - Advise delaying morning dose of carvedilol if experiencing fatigue or malaise. - Encourage physical activity such as using a stationary bicycle to help increase heart rate. - Reassess symptoms and monitor results in a follow-up appointment in June or July.

## 2023-12-07 NOTE — Assessment & Plan Note (Deleted)
 Intermittent bradycardia with heart rates dropping to 45 bpm, occasionally into the 30s, likely exacerbated by carvedilol. No dizziness or syncope, indicating bradycardia is not dangerous. Heaviness and fatigue may relate to bradycardia episodes. Carvedilol dosage reduced to allow heart rate increase, balanced with HCTZ for blood pressure management. - Continue 30-day cardiac monitoring until May 2nd. - Maintain reduced dose of carvedilol at 1/2 tab (3.25 mg) twice daily. - Advise delaying morning dose of carvedilol if experiencing fatigue or malaise. - Encourage physical activity such as using a stationary bicycle to help increase heart rate. - Reassess symptoms and monitor results in a follow-up appointment in June or July.

## 2023-12-07 NOTE — Assessment & Plan Note (Signed)
 Sleep apnea well-managed with CPAP. No reported shortness of breath at night without CPAP. - Continue using CPAP machine as prescribed.

## 2023-12-07 NOTE — Assessment & Plan Note (Signed)
 Stroke history necessitates careful blood pressure management to prevent recurrence. - Continue current stroke prevention measures, including blood pressure management.  As of yet, no signs of A-fib.

## 2023-12-08 ENCOUNTER — Telehealth: Payer: Self-pay | Admitting: Cardiology

## 2023-12-08 NOTE — Telephone Encounter (Signed)
 Abn normal monitor results

## 2023-12-08 NOTE — Telephone Encounter (Signed)
 We don't need to be notified of all of these if No Sx.  [facility]

## 2023-12-08 NOTE — Telephone Encounter (Signed)
 Received page about cardiac event monitor, HR was ~40bpm for 15 seconds at 7:03pm 12/07/23, sinus bradycardia. Review of chart showed that patient was bradycardic on 4/89/25 as well, just decreased on home coreg to reduced dose of 3.25mg  BID. Just saw her cardiologist Dr. Herbie Baltimore 12/05/23 as well who assessed her for bradycardia, no dizziness or syncope during that visit, continued to monitor. Will tag Dr. Herbie Baltimore about event monitor result in case need for discontinuation of coreg in favor of alternative BP management medication vs continued monitoring of her HR and symptoms.  Bella Kennedy, MD Cardiology 12/08/2023

## 2023-12-08 NOTE — Telephone Encounter (Signed)
   Cardiac Monitor Alert  Date of alert:  12/08/2023   Patient Name: Kiara Zamora  DOB: Jan 13, 1941  MRN: [id number]   [facility] Cardiologist: Bryan Lemma, MD  [facility] EP:  None    Monitor Information: Cardiac Event Monitor [[employer]]  Reason:  Acute ischemic stroke Ordering provider:  Dr. Bjorn Pippin    Alert Bradycardia - slowest HR: 39  75 seconds  at 2:34am on 4/11 This is the 3rd alert for this rhythm.   Next Cardiology Appointment   Date:  02/20/24  Provider:  Dr. Herbie Baltimore   The patient was contacted today.  She is asymptomatic. Arrhythmia, symptoms and history reviewed with Dr. Swaz[place].   Plan:  No Changes to Meications    Other:   Marilynn Rail, RN  12/08/2023 12:10 PM

## 2023-12-08 NOTE — Telephone Encounter (Signed)
 Urgent EKG notification

## 2023-12-08 NOTE — Telephone Encounter (Addendum)
   Cardiac Monitor Alert  Date of alert:  12/08/2023   Patient Name: Kiara Zamora  DOB: August 11, 1941  MRN: [id number]   [facility] Cardiologist: Bryan Lemma, MD  [facility] EP:  None    Monitor Information: Cardiac Event Monitor [[employer]]  Reason:  Acute Ischemic Stroke  Ordering provider:  Dr. Bjorn Pippin   Alert Bradycardia - slowest HR: 39 Pause(s) - Longest:  3.5 seconds PVCs - and PACs This is the 4th alert for this rhythm. Date: 12/08/2023 at 11:05 am  Next Cardiology Appointment   Date:  02/20/2024  Provider:  Dr. Herbie Baltimore  The patient was contacted today.  She is asymptomatic. Pt states she was sitting in her chair in the sun room and she was awake, but relaxed.  She has no new symptoms; ongoing c/o feeling tired and her body is "drained" at times, but this is the reason she [facility] out to [facility] to be seen. Currently denies CP, SOB, dizziness, syncope.  Arrhythmia, symptoms and history reviewed with Dr. [place] (DOD).   Plan:  Continue current plan; no changes; continue current parameters which are: Critical alerts for HR less than 40 AND greater than 15 seconds; Also If Pause is greater than 5 seconds.      Candace Cruise [place], [place]  12/08/2023 4:52 PM

## 2023-12-08 NOTE — Telephone Encounter (Signed)
 Please see documentation in alternate 4/11 telephone encounter

## 2023-12-08 NOTE — Telephone Encounter (Signed)
 Monitor company calling back with an Alert. She said pt had a different alert than this morning. They hung up before I could transfer them.

## 2023-12-08 NOTE — Telephone Encounter (Signed)
 I think we can expect these brief episodes of bradycardia - they do not seem to be overly symptomatic.  I suspect partially vagal mediated.    HR 45 is not overly alarming.  If asymptomatic - no med adjustments.   DH

## 2023-12-09 NOTE — Telephone Encounter (Signed)
 If NOT symptomatic - you don't need to keep letting us  know about the same thing over & over -- the monitor is for 30 d looking for Afib.   Randene Bustard, MD

## 2023-12-11 ENCOUNTER — Emergency Department (HOSPITAL_BASED_OUTPATIENT_CLINIC_OR_DEPARTMENT_OTHER)
Admission: EM | Admit: 2023-12-11 | Discharge: 2023-12-11 | Disposition: A | Discharge status: Home / Self Care | Attending: Emergency Medicine | Admitting: Emergency Medicine

## 2023-12-11 ENCOUNTER — Other Ambulatory Visit: Payer: Self-pay

## 2023-12-11 ENCOUNTER — Emergency Department (HOSPITAL_BASED_OUTPATIENT_CLINIC_OR_DEPARTMENT_OTHER)

## 2023-12-11 ENCOUNTER — Encounter (HOSPITAL_BASED_OUTPATIENT_CLINIC_OR_DEPARTMENT_OTHER): Payer: Self-pay

## 2023-12-11 DIAGNOSIS — Z79899 Other long term (current) drug therapy: Secondary | ICD-10-CM | POA: Diagnosis not present

## 2023-12-11 DIAGNOSIS — R519 Headache, unspecified: Secondary | ICD-10-CM | POA: Diagnosis present

## 2023-12-11 DIAGNOSIS — Z7982 Long term (current) use of aspirin: Secondary | ICD-10-CM | POA: Diagnosis not present

## 2023-12-11 DIAGNOSIS — R29810 Facial weakness: Secondary | ICD-10-CM | POA: Insufficient documentation

## 2023-12-11 DIAGNOSIS — Z8673 Personal history of transient ischemic attack (TIA), and cerebral infarction without residual deficits: Secondary | ICD-10-CM | POA: Diagnosis not present

## 2023-12-11 DIAGNOSIS — I444 Left anterior fascicular block: Secondary | ICD-10-CM | POA: Insufficient documentation

## 2023-12-11 DIAGNOSIS — Z7902 Long term (current) use of antithrombotics/antiplatelets: Secondary | ICD-10-CM | POA: Insufficient documentation

## 2023-12-11 DIAGNOSIS — I6782 Cerebral ischemia: Secondary | ICD-10-CM | POA: Diagnosis not present

## 2023-12-11 LAB — PROTIME-INR
INR: 1 (ref 0.8–1.2)
Prothrombin Time: 12.9 s (ref 11.4–15.2)

## 2023-12-11 LAB — DIFFERENTIAL
Abs Immature Granulocytes: 0.02 10*3/uL (ref 0.00–0.07)
Basophils Absolute: 0 10*3/uL (ref 0.0–0.1)
Basophils Relative: 1 %
Eosinophils Absolute: 0.1 10*3/uL (ref 0.0–0.5)
Eosinophils Relative: 3 %
Immature Granulocytes: 0 %
Lymphocytes Relative: 32 %
Lymphs Abs: 1.6 10*3/uL (ref 0.7–4.0)
Monocytes Absolute: 0.8 10*3/uL (ref 0.1–1.0)
Monocytes Relative: 17 %
Neutro Abs: 2.4 10*3/uL (ref 1.7–7.7)
Neutrophils Relative %: 47 %

## 2023-12-11 LAB — URINALYSIS, ROUTINE W REFLEX MICROSCOPIC
Bilirubin Urine: NEGATIVE
Glucose, UA: NEGATIVE mg/dL
Hgb urine dipstick: NEGATIVE
Ketones, ur: NEGATIVE mg/dL
Leukocytes,Ua: NEGATIVE
Nitrite: NEGATIVE
Protein, ur: NEGATIVE mg/dL
Specific Gravity, Urine: 1.015 (ref 1.005–1.030)
pH: 7 (ref 5.0–8.0)

## 2023-12-11 LAB — CBC
HCT: 40.7 % (ref 36.0–46.0)
Hemoglobin: 13.5 g/dL (ref 12.0–15.0)
MCH: 29.9 pg (ref 26.0–34.0)
MCHC: 33.2 g/dL (ref 30.0–36.0)
MCV: 90 fL (ref 80.0–100.0)
Platelets: 326 10*3/uL (ref 150–400)
RBC: 4.52 MIL/uL (ref 3.87–5.11)
RDW: 12.7 % (ref 11.5–15.5)
WBC: 4.9 10*3/uL (ref 4.0–10.5)
nRBC: 0 % (ref 0.0–0.2)

## 2023-12-11 LAB — RAPID URINE DRUG SCREEN, HOSP PERFORMED
Amphetamines: NOT DETECTED
Barbiturates: NOT DETECTED
Benzodiazepines: NOT DETECTED
Cocaine: NOT DETECTED
Opiates: NOT DETECTED
Tetrahydrocannabinol: NOT DETECTED

## 2023-12-11 LAB — COMPREHENSIVE METABOLIC PANEL WITH GFR
ALT: 13 U/L (ref 0–44)
AST: 25 U/L (ref 15–41)
Albumin: 3.9 g/dL (ref 3.5–5.0)
Alkaline Phosphatase: 74 U/L (ref 38–126)
Anion gap: 10 (ref 5–15)
BUN: 7 mg/dL — ABNORMAL LOW (ref 8–23)
CO2: 26 mmol/L (ref 22–32)
Calcium: 9.8 mg/dL (ref 8.9–10.3)
Chloride: 101 mmol/L (ref 98–111)
Creatinine, Ser: 0.56 mg/dL (ref 0.44–1.00)
GFR, Estimated: >60 mL/min (ref >60)
Glucose, Bld: 126 mg/dL — ABNORMAL HIGH (ref 70–99)
Potassium: 3.3 mmol/L — ABNORMAL LOW (ref 3.5–5.1)
Sodium: 137 mmol/L (ref 135–145)
Total Bilirubin: 0.5 mg/dL (ref 0.0–1.2)
Total Protein: 7.8 g/dL (ref 6.5–8.1)

## 2023-12-11 LAB — APTT: aPTT: 33 s (ref 24–36)

## 2023-12-11 LAB — ETHANOL: Alcohol, Ethyl (B): <10 mg/dL (ref <10)

## 2023-12-11 MED ORDER — PREDNISONE 20 MG PO TABS: ORAL_TABLET | ORAL | 0 refills | Status: DC

## 2023-12-11 MED ORDER — VALACYCLOVIR HCL 1 G PO TABS: 1000.0000 mg | ORAL_TABLET | Freq: Three times a day (TID) | ORAL | 0 refills | Status: DC

## 2023-12-11 NOTE — ED Triage Notes (Signed)
 Last night around 1800 started having right sided sharp head pain above ear, which then started having right eyelid droop, and tingling to right side of head. Denies unilateral weakness.  States had Stroke on 3/24. Started on Plavix.  VAN-

## 2023-12-11 NOTE — ED Provider Notes (Signed)
 [facility] Provider Note   CSN: [id number] Arrival date & time: 12/11/23  1152     History  Chief Complaint  Patient presents with   Tingling    Kiara Zamora is a 83 y.o. female.  83 yo F with a cc of R sided facial pain.  Going on since last night.  No rash.  Recent stroke a couple weeks ago.          Home Medications Prior to Admission medications   Medication Sig Start Date End Date Taking? Authorizing Provider  predniSONE (DELTASONE) 20 MG tablet 2 tabs po daily x 5 days 12/11/23  Yes Dayane Hillenburg, DO  valACYclovir (VALTREX) 1000 MG tablet Take 1 tablet (1,000 mg total) by mouth 3 (three) times daily. 12/11/23  Yes Albertus Hughs, DO  acetaminophen (TYLENOL) 500 MG tablet Take 1,000 mg by mouth every 6 (six) hours as needed (arthritis pain).    [provider]  amLODipine (NORVASC) 10 MG tablet Take 10 mg by mouth every morning. 01/21/14   [provider]  aspirin EC 81 MG tablet Take 1 tablet (81 mg total) by mouth daily. Swallow whole. 11/22/23   Ghimire, Estil Heman, MD  carvedilol (COREG) 3.125 MG tablet Take 3.125 mg by mouth 2 (two) times daily with a meal.    [provider]  Cholecalciferol (VITAMIN D-3) 125 MCG (5000 UT) TABS Take 5,000 Units by mouth every morning.    [provider]  clopidogrel (PLAVIX) 75 MG tablet Take 1 tablet (75 mg total) by mouth daily. 11/22/23   Ghimire, Estil Heman, MD  Cyanocobalamin (VITAMIN B-12) 5000 MCG TBDP Take 5,000 mcg by mouth every morning.    [provider]  hydrochlorothiazide (HYDRODIURIL) 25 MG tablet Take 25 mg by mouth daily.    [provider]  lisinopril (ZESTRIL) 40 MG tablet Take 40 mg by mouth daily.    [provider]  meclizine (ANTIVERT) 25 MG tablet Take 25 mg by mouth 3 (three) times daily as needed for dizziness.    [provider]  Menthol, Topical Analgesic, (BIOFREEZE EX) Apply 1 application.  topically daily as needed (knee pain).    [provider]  Multiple Vitamins-Minerals (MULTIVITAL PO) Take 1 tablet by mouth daily.    [provider]  pantoprazole (PROTONIX) 40 MG tablet Take 1 tablet (40 mg total) by mouth 2 (two) times daily. 11/22/23 11/21/24  Burton Casey, MD  PRESCRIPTION MEDICATION Inhale into the lungs at bedtime. CPAP    [provider]  solifenacin (VESICARE) 10 MG tablet Take 10 mg by mouth every morning.    [provider]      Allergies    Ezetimibe, Welchol [colesevelam], Actonel [risedronate], Atorvastatin, Pravastatin, Red yeast rice extract, Sudafed [pseudoephedrine hcl], and Penicillins    Review of Systems   Review of Systems  Physical Exam Updated Vital Signs BP (!) 141/74   Pulse 74   Temp 97.9 F (36.6 C) (Oral)   Resp 16   Ht 5\' 5"  (1.651 m)   Wt 89.4 kg   SpO2 95%   BMI 32.78 kg/m  Physical Exam Vitals and nursing note reviewed.  Constitutional:      General: She is not in acute distress.    Appearance: She is well-developed. She is not diaphoretic.  HENT:     Head: Normocephalic and atraumatic.  Eyes:     Pupils: Pupils are equal, round, and reactive to light.  Cardiovascular:     Rate and Rhythm: Normal rate and regular rhythm.     Heart sounds: No murmur heard.    No friction rub. No gallop.  Pulmonary:     Effort: Pulmonary effort is normal.     Breath sounds: No wheezing or rales.  Abdominal:     General: There is no distension.     Palpations: Abdomen is soft.     Tenderness: There is no abdominal tenderness.  Musculoskeletal:        General: No tenderness.     Cervical back: Normal range of motion and neck supple.  Skin:    General: Skin is warm and dry.  Neurological:     Mental Status: She is alert and oriented to person, place, and time.     Comments: Right sided facial weakness, subtle, includes forehead.  Psychiatric:        Behavior: Behavior normal.     ED Results  / Procedures / Treatments   Labs (all labs ordered are listed, but only abnormal results are displayed) Labs Reviewed  COMPREHENSIVE METABOLIC PANEL WITH GFR - Abnormal; Notable for the following components:      Result Value   Potassium 3.3 (*)    Glucose, Bld 126 (*)    BUN 7 (*)    All other components within normal limits  ETHANOL  PROTIME-INR  APTT  CBC  DIFFERENTIAL  RAPID URINE DRUG SCREEN, HOSP PERFORMED  URINALYSIS, ROUTINE W REFLEX MICROSCOPIC    EKG EKG Interpretation Date/Time:  Monday December 11 2023 12:05:45 EDT Ventricular Rate:  83 PR Interval:  197 QRS Duration:  96 QT Interval:  395 QTC Calculation: 465 R Axis:   -47  Text Interpretation: Sinus rhythm Probable left atrial enlargement Left anterior fascicular block Left ventricular hypertrophy Anterior Q waves, possibly due to LVH Baseline wander in lead(s) V3 Since last tracing rate faster Otherwise no significant change Confirmed by Melene Plan [phone]) on 12/11/2023 1:04:19 PM  Radiology CT HEAD WO CONTRAST Result Date: 12/11/2023 CLINICAL DATA:  Neuro deficit, acute, stroke suspected. Right-sided headache. Eyelid droop. EXAM: CT HEAD WITHOUT CONTRAST TECHNIQUE: Contiguous axial images were obtained from the base of the skull through the vertex without intravenous contrast. RADIATION DOSE REDUCTION: This exam was performed according to the departmental dose-optimization program which includes automated exposure control, adjustment of the mA and/or kV according to patient size and/or use of iterative reconstruction technique. COMPARISON:  11/21/2023 FINDINGS: Brain: Diminishing conspicuity of bilateral inferior cerebellar infarctions, larger on the left than the right. No evidence of new posterior fossa insult. No hemorrhage. No mass effect. Cerebral hemispheres show advanced chronic small-vessel ischemic changes throughout the white matter. No visible acute infarction, mass lesion, hemorrhage, hydrocephalus or  extra-axial collection. Vascular: There is atherosclerotic calcification of the major vessels at the base of the brain. Skull: Negative Sinuses/Orbits: Clear/normal Other: None IMPRESSION: 1. Diminishing conspicuity of bilateral inferior cerebellar infarctions, larger on the left than the right. No evidence of new posterior fossa insult. No hemorrhage. No mass effect. 2. Advanced chronic small-vessel ischemic changes of the cerebral hemispheric white matter. Electronically Signed   By: Paulina Fusi M.D.   On: 12/11/2023 14:19    Procedures Procedures    Medications Ordered in ED Medications - No data to display  ED Course/ Medical Decision Making/ A&P  Medical Decision Making Amount and/or Complexity of Data Reviewed Labs: ordered. Radiology: ordered.  Risk Prescription drug management.   83 yo F with a chief complaints of right-sided facial pain and right eyelid droop.  She said that this started last night.  She recently had a stroke a couple weeks ago.  Has been doing relatively well post.  Likely Bell's palsy with some forehead involvement on exam.  It is quite subtle on exam though we will obtain head imaging.  CT of the head here with what seems like normal evolution of her stroke.  No hemorrhagic conversion.  No obvious new stroke.  Blood work without significant electrolyte abnormalities.  No anemia.  I discussed results with the patient and family.  I discussed rationale for emergent MRI imaging.  They are currently declining at this time.  They would like to discuss this with their neurologist.  I will start her on steroids and antiviral medications for possible Bell's.  2:36 PM:  I have discussed the diagnosis/risks/treatment options with the patient and family.  Evaluation and diagnostic testing in the emergency department does not suggest an emergent condition requiring admission or immediate intervention beyond what has been performed at  this time.  They will follow up with Neuro, PCP, optho. We also discussed returning to the ED immediately if new or worsening sx occur. We discussed the sx which are most concerning (e.g., sudden worsening pain, fever, inability to tolerate by mouth, stroke s/sx) that necessitate immediate return. Medications administered to the patient during their visit and any new prescriptions provided to the patient are listed below.  Medications given during this visit Medications - No data to display   The patient appears reasonably screen and/or stabilized for discharge and I doubt any other medical condition or other [facility] requiring further screening, evaluation, or treatment in the ED at this time prior to discharge.           Final Clinical Impression(s) / ED Diagnoses Final diagnoses:  Facial weakness    Rx / DC Orders ED Discharge Orders          Ordered    predniSONE (DELTASONE) 20 MG tablet        12/11/23 1433    valACYclovir (VALTREX) 1000 MG tablet  3 times daily        12/11/23 1433    Ambulatory referral to Neurology       Comments: Facial n palsy?   12/11/23 1434              Albertus Hughs, DO 12/11/23 1436

## 2023-12-11 NOTE — Discharge Instructions (Signed)
 Please follow-up with your neurologist and ophthalmologist.  I have started you on steroids and antiviral medications.

## 2023-12-12 ENCOUNTER — Ambulatory Visit: Attending: Cardiology

## 2023-12-12 DIAGNOSIS — I639 Cerebral infarction, unspecified: Secondary | ICD-10-CM

## 2023-12-18 ENCOUNTER — Encounter: Payer: Self-pay | Admitting: Diagnostic Neuroimaging

## 2023-12-18 ENCOUNTER — Ambulatory Visit: Admitting: Diagnostic Neuroimaging

## 2023-12-18 VITALS — BP 102/68 | HR 77 | Ht 65.0 in | Wt 197.0 lb

## 2023-12-18 DIAGNOSIS — I10 Essential (primary) hypertension: Secondary | ICD-10-CM

## 2023-12-18 DIAGNOSIS — E785 Hyperlipidemia, unspecified: Secondary | ICD-10-CM | POA: Diagnosis not present

## 2023-12-18 DIAGNOSIS — I63443 Cerebral infarction due to embolism of bilateral cerebellar arteries: Secondary | ICD-10-CM

## 2023-12-18 DIAGNOSIS — G4733 Obstructive sleep apnea (adult) (pediatric): Secondary | ICD-10-CM | POA: Diagnosis not present

## 2023-12-18 NOTE — Patient Instructions (Addendum)
  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  CTA head & neck no large vessel stenosis or occlusion MRI acute left cerebellar PICA distribution moderate-sized stroke and small right medial cerebellar acute infarct 2D Echo --> Ejection fraction 55 to 60%.  Left atrial size normal.   LDL 143 HgbA1c 5.4 VTE prophylaxis - lovenox  No antithrombotic prior to admission, completed aspirin  81 mg daily and clopidogrel  75 mg daily for 3 weeks and now aspirin  81mg  alone. Currently with 30 day cardiac monitoring; if negative, then refer for implanted loop recorder   Hypertension Home meds:  amlodipine, coreg, lisinopril Blood Pressure Goal: BP less than 120/80    Hyperlipidemia LDL 143, goal < 70 Allergy to zetia; intolerance to statins Consider PCSK9 inhibitors; consider referral to lipid disorder clinic   Other Stroke Risk Factors Obesity, Body mass index is 32.7 kg/m., BMI >/= 30 associated with increased stroke risk, recommend weight loss, diet and exercise as appropriate  Obstructive sleep apnea --> CPAP   RIGHT PTOSIS, RIGHT HEADACHES (December 10, 2023) - went to ER, possible bells palsy, but not clear; completing prednisone  and valacyclovir ; now resolved

## 2023-12-18 NOTE — Progress Notes (Signed)
 [facility]  PATIENT: Kiara Zamora DOB: 02-Jul-1941  REFERRING CLINICIAN: Burton Casey, MD HISTORY FROM: patient  REASON FOR VISIT: new consult   HISTORICAL  CHIEF COMPLAINT:  Chief Complaint  Patient presents with   Facial Pain    Rm 7 with spouse Pt is well, reports she had a recent stroke in March. Started having R sided facial pain and tingling on 4/13.  Reports symptoms have currently  resolved.     HISTORY OF PRESENT ILLNESS:   83 year old female here for evaluation stroke follow-up.  History of hypertension, hyperlipidemia, sleep apnea, arthritis.  Presented to hospital on 11/21/2023 due to severe vertigo.  She was admitted for stroke workup.  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  Stroke workup was completed.  Patient currently wearing 30-day cardiac monitor.  Symptoms improved and she was discharged home.  12/11/23 patient was admitted here for different issue, consisting of right posterior neck and right posterior head shooting pain rating to her right eye and right ear.  She felt some swelling around her right eyelid with some possible mild drooping of the eyelid.  Had CT scan of the head which is unremarkable.  Diagnosed with possible Bell's palsy and prescribed prednisone  and valacyclovir .    REVIEW OF SYSTEMS: Full 14 system review of systems performed and negative with exception of: as per HPI.  ALLERGIES: Allergies  Allergen Reactions   Ezetimibe Shortness Of Breath   Welchol [Colesevelam] Shortness Of Breath   Actonel [Risedronate] Other (See Comments)    Throat tightness   Atorvastatin Other (See Comments)    Joint pain/muscle aches   Pravastatin Other (See Comments)   Red Yeast Rice Extract Other (See Comments)    Joint pain/muscle aches    Sudafed [Pseudoephedrine Hcl] Itching    Severe itching   Penicillins Rash    HOME MEDICATIONS: Outpatient Medications Prior to  Visit  Medication Sig Dispense Refill   acetaminophen  (TYLENOL ) 500 MG tablet Take 1,000 mg by mouth every 6 (six) hours as needed (arthritis pain).     amLODipine (NORVASC) 10 MG tablet Take 10 mg by mouth every morning.     aspirin  EC 81 MG tablet Take 1 tablet (81 mg total) by mouth daily. Swallow whole. 30 tablet 12   carvedilol (COREG) 3.125 MG tablet Take 3.125 mg by mouth 2 (two) times daily with a meal.     Cholecalciferol (VITAMIN D-3) 125 MCG (5000 UT) TABS Take 5,000 Units by mouth every morning.     Cyanocobalamin (VITAMIN B-12) 5000 MCG TBDP Take 5,000 mcg by mouth every morning.     hydrochlorothiazide (HYDRODIURIL) 25 MG tablet Take 25 mg by mouth daily.     lisinopril (ZESTRIL) 40 MG tablet Take 40 mg by mouth daily.     meclizine (ANTIVERT) 25 MG tablet Take 25 mg by mouth 3 (three) times daily as needed for dizziness.     Menthol, Topical Analgesic, (BIOFREEZE EX) Apply 1 application. topically daily as needed (knee pain).     Multiple Vitamins-Minerals (MULTIVITAL PO) Take 1 tablet by mouth daily.     pantoprazole  (PROTONIX ) 40 MG tablet Take 1 tablet (40 mg total) by mouth 2 (two) times daily. 60 tablet 1   PRESCRIPTION MEDICATION Inhale into the lungs at bedtime. CPAP     solifenacin (VESICARE) 10 MG tablet Take 10 mg by mouth every morning.     valACYclovir  (VALTREX ) 1000 MG tablet Take 1 tablet (1,000  mg total) by mouth 3 (three) times daily. 21 tablet 0   clopidogrel  (PLAVIX ) 75 MG tablet Take 1 tablet (75 mg total) by mouth daily. (Patient not taking: Reported on 12/18/2023) 21 tablet 0   predniSONE  (DELTASONE ) 20 MG tablet 2 tabs po daily x 5 days (Patient not taking: Reported on 12/18/2023) 10 tablet 0   No facility-administered medications prior to visit.    PAST MEDICAL HISTORY: Past Medical History:  Diagnosis Date   Arthritis    Cancer (HCC)    Hyperlipidemia    Hypertension    Sleep apnea    CPAP    PAST SURGICAL HISTORY: Past Surgical History:   Procedure Laterality Date   CHOLECYSTECTOMY     ESOPHAGOGASTRODUODENOSCOPY  06/05/2017   OOPHORECTOMY     UMBILICAL HERNIA REPAIR N/A 12/09/2021   Procedure: OPEN UMBILICAL HERNIA WITH MESH;  Surgeon: Kinsinger, Alphonso Aschoff, MD;  Location: [facility];  Service: General;  Laterality: N/A;   VAGINAL HYSTERECTOMY      FAMILY HISTORY: Family History  Problem Relation Age of Onset   Hypertension Mother    Esophageal cancer Mother    Colon cancer Father     SOCIAL HISTORY: Social History   Socioeconomic History   Marital status: Married    Spouse name: Not on file   Number of children: 3   Years of education: Not on file   Highest education level: Not on file  Occupational History   Not on file  Tobacco Use   Smoking status: Never   Smokeless tobacco: Never  Vaping Use   Vaping status: Never Used  Substance and Sexual Activity   Alcohol use: No   Drug use: No   Sexual activity: Yes    Partners: Male  Other Topics Concern   Not on file  Social History Narrative   Lives at home with husband.    Social Drivers of Corporate investment banker Strain: Not on file  Food Insecurity: No Food Insecurity (11/21/2023)   Hunger Vital Sign    Worried About Running Out of Food in the Last Year: Never true    Ran Out of Food in the Last Year: Never true  Transportation Needs: No Transportation Needs (11/21/2023)   PRAPARE - Administrator, Civil Service (Medical): No    Lack of Transportation (Non-Medical): No  Physical Activity: Not on file  Stress: Not on file  Social Connections: Socially Integrated (11/21/2023)   Social Connection and Isolation Panel [NHANES]    Frequency of Communication with Friends and Family: Three times a week    Frequency of Social Gatherings with Friends and Family: Three times a week    Attends Religious Services: More than 4 times per year    Active Member of Clubs or Organizations: Yes    Attends Banker Meetings: 1 to 4 times  per year    Marital Status: Married  Catering manager Violence: Not At Risk (11/21/2023)   Humiliation, Afraid, Rape, and Kick questionnaire    Fear of Current or Ex-Partner: No    Emotionally Abused: No    Physically Abused: No    Sexually Abused: No     PHYSICAL EXAM  GENERAL EXAM/CONSTITUTIONAL: Vitals:  Vitals:   12/18/23 0923  BP: 102/68  Pulse: 77  Weight: 197 lb (89.4 kg)  Height: 5\' 5"  (1.651 m)   Body mass index is 32.78 kg/m. Wt Readings from Last 3 Encounters:  12/18/23 197 lb (89.4 kg)  12/11/23  197 lb (89.4 kg)  12/05/23 197 lb 3.2 oz (89.4 kg)   Patient is in no distress; well developed, nourished and groomed; neck is supple  CARDIOVASCULAR: Examination of carotid arteries is normal; no carotid bruits Regular rate and rhythm, no murmurs Examination of peripheral vascular system by observation and palpation is normal  EYES: Ophthalmoscopic exam of optic discs and posterior segments is normal; no papilledema or hemorrhages No results found.  MUSCULOSKELETAL: Gait, strength, tone, movements noted in Neurologic exam below  NEUROLOGIC: MENTAL STATUS:      No data to display         awake, alert, oriented to person, place and time recent and remote memory intact normal attention and concentration language fluent, comprehension intact, naming intact fund of knowledge appropriate  CRANIAL NERVE:  2nd - no papilledema on fundoscopic exam 2nd, 3rd, 4th, 6th - pupils equal and reactive to light, visual fields full to confrontation, extraocular muscles intact, no nystagmus 5th - facial sensation symmetric 7th - facial strength symmetric 8th - hearing intact 9th - palate elevates symmetrically, uvula midline 11th - shoulder shrug symmetric 12th - tongue protrusion midline  MOTOR:  normal bulk and tone, full strength in the BUE, BLE  SENSORY:  normal and symmetric to light touch, temperature, vibration  COORDINATION:  finger-nose-finger, fine  finger movements normal  REFLEXES:  deep tendon reflexes 2+ and symmetric  GAIT/STATION:  narrow based gait; CAUTIOUS GAIT, SLOW     DIAGNOSTIC DATA (LABS, IMAGING, TESTING) - I reviewed patient records, labs, notes, testing and imaging myself where available.  Lab Results  Component Value Date   WBC 4.9 12/11/2023   HGB 13.5 12/11/2023   HCT 40.7 12/11/2023   MCV 90.0 12/11/2023   PLT 326 12/11/2023      Component Value Date/Time   NA 137 12/11/2023 1223   K 3.3 (L) 12/11/2023 1223   CL 101 12/11/2023 1223   CO2 26 12/11/2023 1223   GLUCOSE 126 (H) 12/11/2023 1223   BUN 7 (L) 12/11/2023 1223   CREATININE 0.56 12/11/2023 1223   CALCIUM 9.8 12/11/2023 1223   PROT 7.8 12/11/2023 1223   ALBUMIN 3.9 12/11/2023 1223   AST 25 12/11/2023 1223   ALT 13 12/11/2023 1223   ALKPHOS 74 12/11/2023 1223   BILITOT 0.5 12/11/2023 1223   GFRNONAA >60 12/11/2023 1223   Lab Results  Component Value Date   CHOL 212 (H) 11/21/2023   HDL 53 11/21/2023   LDLCALC 143 (H) 11/21/2023   TRIG 81 11/21/2023   CHOLHDL 4.0 11/21/2023   Lab Results  Component Value Date   HGBA1C 5.4 11/21/2023   No results found for: "VITAMINB12" No results found for: "TSH"  11/21/23 MRI brain [I reviewed images myself and agree with interpretation. Reviewed with patient and husband. -VRP]  1. Intermittently motion degraded examination. 2. Moderate-to-large acute infarct within the left cerebellar hemisphere (PICA territory). No posterior fossa mass effect at this time. Occlusion versus severe near occlusive stenosis of the proximal left posterior inferior cerebellar artery noted on yesterday's CTA head/neck. 3. Small-to-moderate-sized acute infarct within the medial right cerebellar hemisphere. 4. Moderate chronic small vessel ischemic changes within the cerebral white matter. 5. Several chronic microhemorrhages within the supratentorial and infratentorial brain. The deep gray nuclei predominance  suggests sequelae of chronic hypertensive microangiopathy. 6. Mild generalized cerebral atrophy.  12/11/23 CT head [I reviewed images myself and agree with interpretation. -VRP]  1. Diminishing conspicuity of bilateral inferior cerebellar infarctions, larger on the left than  the right. No evidence of new posterior fossa insult. No hemorrhage. No mass effect. 2. Advanced chronic small-vessel ischemic changes of the cerebral hemispheric white matter.   ASSESSMENT AND PLAN  83 y.o. year old female here with:   Dx:  1. 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  CTA head & neck no large vessel stenosis or occlusion MRI acute left cerebellar PICA distribution moderate-sized stroke and small right medial cerebellar acute infarct 2D Echo --> Ejection fraction 55 to 60%.  Left atrial size normal.   LDL 143 HgbA1c 5.4 VTE prophylaxis - lovenox  No antithrombotic prior to admission, completed aspirin  81 mg daily and clopidogrel  75 mg daily for 3 weeks and now aspirin  81mg  alone. Currently with 30 day cardiac monitoring; if negative, then refer for  implanted loop recorder   Hypertension Home meds:  amlodipine, coreg, lisinopril Blood Pressure Goal: BP less than 120/80    Hyperlipidemia LDL 143, goal < 70 Allergy to zetia and statins Consider PCSK9 inhibitors; consider referral to lipid disorder clinic   Other Stroke Risk Factors Obesity, Body mass index is 32.7 kg/m., BMI >/= 30 associated with increased stroke risk, recommend weight loss, diet and exercise as appropriate  Obstructive sleep apnea --> CPAP   RIGHT PTOSIS, RIGHT HEADACHES (December 10, 2023) - went to ER and was dx'd with possible rightbells palsy, but not clear cut based on symptoms; completing  prednisone  and valacyclovir ; now resolved - could have been cervico-genic headache - could have been TIA, but already on medical mgmt, and had workup completed recently  Return for return to PCP, pending if symptoms worsen or fail to improve.  I reviewed images, labs, notes, records myself. I summarized findings and reviewed with patient, for this high risk condition (stroke) requiring high complexity decision making.    Omega Bible, MD 12/18/2023, 10:02 AM Certified in Neurology, Neurophysiology and Neuroimaging  [facility] [address] [place], [place] [address] [phone]

## 2023-12-20 ENCOUNTER — Telehealth: Payer: Self-pay | Admitting: Cardiology

## 2023-12-20 NOTE — Telephone Encounter (Signed)
 Caller [facility]) is reporting an urgent EKG alert.

## 2023-12-20 NOTE — Telephone Encounter (Signed)
   Cardiac Monitor Alert  Date of alert:  12/20/2023   Patient Name: Kiara Zamora  DOB: 1940/09/21  MRN: [id number]   [facility] Cardiologist: Randene Bustard, MD  [facility] EP:  None    Monitor Information: Cardiac Event Monitor [[employer]]  Reason:  Acute ischemic stroke  Ordering provider:  Dr. Alda Amas    Alert Bradycardia - slowest HR: 34 This is the  6  alert for this rhythm.   Received call from [place] with [employer] states:  -Urgent auto trigger at 10:49 am  -Bradycardia down to 34BPM   Next Cardiology Appointment   Date:  02/20/24  Provider:  9:20 AM  The patient was contacted today.  She is asymptomatic. Patient states:   -she has been busy doing work around the home   -she does not have any issues or concerns today   -"I am having a pretty good day"   -she continues to take carvedilol 3.125 mg BID Patient denies:   -lightheadedness/dizziness   -SOB   -weakness   -increased fatigue  Arrhythmia, symptoms and history reviewed with .   Plan:     Other:  RN unable to locate fax at this time, DOD not available  RN will check fax tomorrow morning and follow up with provider in office  FWD to Dr. Addie Holstein to be made aware   Hilton Lucky, RN  12/20/2023 5:04 PM

## 2023-12-21 ENCOUNTER — Telehealth: Payer: Self-pay | Admitting: Student

## 2023-12-21 NOTE — Telephone Encounter (Signed)
 [place]  Reviewed with DOD Dr. Lavonne Prairie  Dr. Lavonne Prairie stated no changes at this time, patient to follow up as planned

## 2023-12-21 NOTE — Telephone Encounter (Signed)
 Patient identification verified by 2 forms. Hilton Lucky, RN    Called and spoke to patient  Patient states she received a missed call this morning  Informed patient call was from RN but realized alert was addressed yesterday  Informed patient no further actions needed at this time  Patient verbalized understanding, no questions at this time

## 2023-12-21 NOTE — Telephone Encounter (Signed)
 Patient returned RN's call regarding results.

## 2023-12-21 NOTE — Telephone Encounter (Signed)
   Received notification from monitor company that patient had an abnormal EKG. Called and spoke with rep who stated patient had a 15 second episode of sinus bradycardia with rate of 37 bpm around 6:12pm this evening and then returned to normal sinus rhythm with rate of 74 bpm. This was an automatic trigger. Patient did not report having any symptoms. It looks like we have received multiple similar alerts.    I tried to call patient but could not reach her. Left a message to call us  back. Will route this message to the triage pool - triage, can you please try to follow-up on this tomorrow. Thank you!  Brayleigh Rybacki E Layson Bertsch, PA-C 12/21/2023 8:48 PM

## 2023-12-22 ENCOUNTER — Telehealth: Payer: Self-pay

## 2023-12-22 NOTE — Telephone Encounter (Signed)
 Received monitor alert 4/24 at 18:11 sinus bradycardia rate 37.Spoke to patient she was not aware.Stated she has been having occasional episodes she feels like air being let out of her body.Spoke to DOD Dr.Harding he reviewed .Advised continue same medications.He will wait on final report.Advised to keep appointment with Dr.Harding as planned and call sooner if needed.

## 2023-12-22 NOTE — Telephone Encounter (Signed)
See previous 4/25 telephone note.

## 2023-12-24 ENCOUNTER — Telehealth: Payer: Self-pay | Admitting: Home Health

## 2023-12-24 NOTE — Telephone Encounter (Signed)
 [facility] called after hours line today reporting patient had an episode of sinus bradycardia with HR 29bpm lasting 17s on 2059 on 12/23/23,followed by sinus rhythm with HR 70-80s, auto-triggered event, no one has reached out to the patient.   Called the patient, she states she had a very busy day yesterday, around 9pm she was resting, felt very tired and drained at the time. She denied any chest pain, SOB, dizziness, syncope.   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   She had her coreg reduced to 3.125mg  BID recently, given continued bradycardia episodes, advised the patient to stop coreg from today. Monitor BP, if trend elevated, consider alternative anti-hypertensive. Will forward this message to Dr Addie Holstein.

## 2023-12-25 ENCOUNTER — Telehealth: Payer: Self-pay | Admitting: *Deleted

## 2023-12-25 ENCOUNTER — Encounter: Payer: Self-pay | Admitting: Cardiology

## 2023-12-25 NOTE — Telephone Encounter (Signed)
   Cardiac Monitor Alert  Date of alert:  12/25/2023   Patient Name: Kiara Zamora  DOB: [birth year]  MRN: [id number]   [facility] Cardiologist: Randene Bustard, MD  [facility] EP:  None    Monitor Information: Cardiac Event Monitor [[employer]]  Reason:  CVA Ordering provider:  schumann   Alert Bradycardia - slowest HR: 34 This is the  7  alert for this rhythm.   Next Cardiology Appointment   Date:  02/20/24  Provider:  harding  The patient was contacted today.  She is asymptomatic. Arrhythmia, symptoms and history reviewed with DOD dr Lawana Pray.   Plan:  continue to monitor     Augustin Leber, RN  12/25/2023 1:59 PM

## 2023-12-27 NOTE — Telephone Encounter (Signed)
 I thought I had already mentioned for her to stop the carvedilol during one of the other notifications.  I am glad you would have better stop it.  Randene Bustard, MD

## 2024-01-04 ENCOUNTER — Telehealth: Payer: Self-pay | Admitting: Diagnostic Neuroimaging

## 2024-01-04 NOTE — Telephone Encounter (Signed)
 Baker Bon from [employer] called to speak to [place] about submitting an appeal for Pt.  Callback number is [phone]

## 2024-02-05 ENCOUNTER — Inpatient Hospital Stay: Admitting: Diagnostic Neuroimaging

## 2024-02-12 ENCOUNTER — Ambulatory Visit: Payer: Self-pay | Admitting: Cardiology

## 2024-02-12 DIAGNOSIS — I639 Cerebral infarction, unspecified: Secondary | ICD-10-CM | POA: Diagnosis not present

## 2024-02-20 ENCOUNTER — Ambulatory Visit: Attending: Cardiology | Admitting: Cardiology

## 2024-02-20 ENCOUNTER — Ambulatory Visit: Admitting: Cardiology

## 2024-02-20 ENCOUNTER — Encounter: Payer: Self-pay | Admitting: Cardiology

## 2024-02-20 VITALS — BP 142/86 | HR 57 | Ht 65.0 in | Wt 196.8 lb

## 2024-02-20 DIAGNOSIS — Z8673 Personal history of transient ischemic attack (TIA), and cerebral infarction without residual deficits: Secondary | ICD-10-CM

## 2024-02-20 DIAGNOSIS — R001 Bradycardia, unspecified: Secondary | ICD-10-CM | POA: Diagnosis not present

## 2024-02-20 DIAGNOSIS — E785 Hyperlipidemia, unspecified: Secondary | ICD-10-CM

## 2024-02-20 DIAGNOSIS — I10 Essential (primary) hypertension: Secondary | ICD-10-CM

## 2024-02-20 NOTE — Patient Instructions (Addendum)
 Medication Instructions:  No changes  *If you need a refill on your cardiac medications before your next appointment, please call your pharmacy*   Lab Work: Not needed    Testing/Procedures:  NOT NEEDED  Follow-Up: At [facility], you and your health needs are our priority.  As part of our continuing mission to provide you with exceptional heart care, we have created designated Provider Care Teams.  These Care Teams include your primary Cardiologist (physician) and Advanced Practice Providers (APPs -  Physician Assistants and Nurse Practitioners) who all work together to provide you with the care you need, when you need it.     Your next appointment:   3 month(s)  The format for your next appointment:   In Person  Provider:      DR CATHLYN BIRMINGHAM   AND 12 MONTHS WITH Alm Clay, MD   You have been referred to Dr CATHLYN BIRMINGHAM   for Electrophysiology

## 2024-02-20 NOTE — Progress Notes (Signed)
 Cardiology Office Note:  .   Date:  02/28/2024  ID:  Kiara Zamora, DOB 30-Mar-1941, MRN [id number] PCP: Leonel Cole, MD  [facility] Providers Cardiologist:  Alm Clay, MD     Chief Complaint  Patient presents with   Follow-up   Palpitations    Dizzy spells much better after stopping Carvedilol     Patient Profile: .     Kiara Zamora is a 83 y.o. female  with a PMH reviewed below who presents here for 2 month f/u to discuss Monitor Results & change in symptoms being off of Beta Blocker at the request of Leonel Cole, MD.  Kiara Zamora is a mildly obese 83 y.o. female with a PMH notable for HTN, HLD (statin myopathy), OSA on CPAP and recent CVA who presents here for Hospital Follow-Up evaluation of Bradycardia on Monitor at the request of Leonel Cole, MD.  She was recently hospitalized for left-sided cerebral CVA: 3/24>> CTA head/neck: No LVO/hemodynamically significant stenosis 3/25>> MRI brain: Moderate to large infarct left cerebral hemisphere.  Small to moderate-sized acute infarct right cerebellar hemisphere. 3/25>> TTE: EF 55-60%, grade 1 diastolic dysfunction. 30-day [employer] monitor ordered LDL 143, A1c 5.4      Kiara Zamora was seen for initial visit posthospital on April 8 for evaluation of bradycardia with heart rates in the 30s noted on her event monitor (30-day event monitor ordered for stroke assessment. in addition to having).  In addition to having slower heart rate episode she was also feeling tachycardia with short runs of fast heart rates.  For the most part her blood pressures are fine and her heart rates are borderline.  Please see me reduced carvedilol to 3.25 mg twice daily and start HCTZ. => She has intermittent episodes of heavy asymmetry mostly during the day and necessitating frequent naps.  No syncope but she was having these episodes of almost out of body experience sensation where she reveals that she may be falling..  She felt  less overall sense of heaviness to the dizziness but not as though she actually was in a pass out.  The worst episodes were when she went to hospital. = Plan was to wean off of beta-blocker> because of intermittent bradycardia episodes noted heart rates dropping in the 40s and even 30s.  Initial plan was to delay morning dose of an increased physical activity.  As the monitor was continued, we decided to stop carvedilol together. => Also in the interim, with the assistance of the neurology team they will try to work on getting her  started on either Leqvio or Repatha for her lipids.   Subjective  Discussed the use of AI scribe software for clinical note transcription with the patient, who gave verbal consent to proceed.  History of Present Illness History of Present Illness Kiara Zamora is an 83 year old female with heart rate variability who presents for evaluation of heart rate issues and medication side effects.  She has not experienced any dizziness or syncope since discontinuing carvedilol approximately two to three days after stopping the medication. There is a significant improvement in her symptoms after stopping carvedilol, which was initially prescribed due to episodes of tachycardia and bradycardia observed on a monitor. She occasionally experiences a 'flutter' sensation in her chest, which she can alleviate by taking a deep breath. No chest pain, pressure, or palpitations, and she has not experienced any syncope or dizziness recently.  No more of the strange episodes of feeling  like she was floating or dizzy or heaviness.  Much better since stopping beta-blocker.  Overall energy level improved.  Her blood pressure has been well-controlled, with recent readings around 111-120/70-78 mmHg, although it was slightly elevated during today's visit. She monitors her blood pressure every morning and notes that today is the first time it has been high in a while.  She uses a CPAP machine for  sleep apnea and reports an incident where she woke up and removed the machine without remembering. Her partner noted that she was breathing but had to shake her to wake her up. She speculates that the machine might have been leaking air, causing discomfort and waking her up.  She has a history of a stroke and mentions that she has not experienced any dizziness or syncope in weeks. She does not have any orthopnea. She avoids stairs due to depth perception issues and uses a wheelchair ramp at church.  Regarding her cholesterol management, her neurologist recommended injectable medications, but they are costly even after insurance. She has tried various cholesterol medications in the past without success due to adverse reactions.     Objective   CV meds: Amlodipine 10 mg daily, HCTZ 25 mg daily, lisinopril 40 mg daily  Studies Reviewed: SABRA   EKG Interpretation Date/Time:  Tuesday February 20 2024 09:43:30 EDT Ventricular Rate:  57 PR Interval:  198 QRS Duration:  88 QT Interval:  410 QTC Calculation: 399 R Axis:   -27  Text Interpretation: Sinus bradycardia with marked sinus arrhythmia When compared with ECG of 11-Dec-2023 12:05, PREVIOUS ECG IS PRESENT Confirmed by Anner Lenis ([id number]) on 02/20/2024 10:05:13 AM brief run of tachycardia followed by significant bradycardia  30-day event monitor: Predominant rhythm is sinus with a rate range at 27 to 134 bpm and average 60 bpm.  There were 10 episodes of pauses at least 2 seconds with the longest being 3.5 seconds.  No atrial fibrillation or sustained arrhythmias.  2% PACs 1% PVC.  With sinus bradycardia with gentle escape beats, heart rate slow to high 20s and low 30s. ==> Beta-blocker discontinued.  Labs 12/29/2023: TC 204, TG 132, HDL 50, LDL 130.  K+ 4.0,; 12/11/2023-Hgb 13.5, Cr 0.56  Risk Assessment/Calculations:     HYPERTENSION CONTROL Vitals:   02/20/24 0947   BP: (!) 150/88 (!) 142/86    Allowing for mild permissive hypertension         Physical Exam:   VS:  BP (!) 142/86   Pulse (!) 57   Ht 5' 5 (1.651 m)   Wt 196 lb 12.8 oz (89.3 kg)   SpO2 94%   BMI 32.75 kg/m    Initial BP 150/88 - rushed this AM - but settled down  Wt Readings from Last 3 Encounters:  02/20/24 196 lb 12.8 oz (89.3 kg)  12/18/23 197 lb (89.4 kg)  12/11/23 197 lb (89.4 kg)    GEN: Well nourished, well groomed in no acute distress; mildly obese NECK: No JVD; No carotid bruits CARDIAC: Normal S1, S2; RRR, no murmurs, rubs, gallops RESPIRATORY:  Clear to auscultation without rales, wheezing or rhonchi ; nonlabored, good air movement. ABDOMEN: Soft, non-tender, non-distended EXTREMITIES:  No edema; No deformity      ASSESSMENT AND PLAN: .    Problem List Items Addressed This Visit       Cardiology Problems   Hyperlipidemia with target LDL less than 70 (Chronic)   Statin intolerant with myopathy. => Currently under evaluation for Repatha versus Praluent prior  authorization and potentially Leqvio.  Right now this being done by her primary and neurology.  If not able to be successful, we can potentially try our lipid clinic.      Relevant Orders   EKG 12-Lead (Completed)   Primary hypertension - Primary (Chronic)   Blood pressure generally well-controlled, occasional high readings. Carvedilol discontinued. Alternative antihypertensive may be needed if blood pressure becomes uncontrolled.  Advised regular blood pressure monitoring. - Monitor blood pressure regularly. - Consider alternative antihypertensive if blood pressure becomes uncontrolled. - Given history of stroke and dizziness issues, would allow for mild permissive hypertension      Relevant Orders   EKG 12-Lead (Completed)     Other   Hx of ischemic left MCA stroke (Chronic)   History of stroke. Advised to manage cholesterol to prevent recurrence. Discussed cost challenges of cholesterol-lowering medications and potential assistance programs. Advised on healthy diet  for cholesterol management.  No evidence of A-fib on monitor Discussed options including Repatha, Praluent, Leqvio, and potential cost assistance through lipid clinic. - Explore assistance programs for cholesterol-lowering medications. - Maintain a healthy diet to support cholesterol management. - Continue aspirin  81 mg daily along with BP control using lisinopril 40 [place] daily and amlodipine 10 mg daily and HCTZ 25 mg daily. - For reasons discussed elsewhere, avoid beta-blocker.      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 Carvedilol discontinued due to symptomatic bradycardia. Asymptomatic off beta blocker but at risk for future bradycardia. Discussed potential electrophysiologist referral if symptoms recur. Emphasized monitoring for dizziness or syncope. - Refer to electrophysiologist due to concerns of potential for high-grade AV block/sick sinus syndrome.. - Avoid beta blockers due to symptomatic bradycardia.      Relevant Orders   Ambulatory referral to Cardiac Electrophysiology            Follow-Up: Return in about 1 year (around 02/19/2025) for 1 Yr Follow-up, [employer].     Signed, Alm MICAEL Clay, MD, MS Alm Clay, M.D., M.S. Interventional Chartered certified accountant  Pager # [phone]

## 2024-02-27 ENCOUNTER — Encounter: Payer: Self-pay | Admitting: Cardiology

## 2024-02-27 NOTE — Assessment & Plan Note (Signed)
 Statin intolerant with myopathy. => Currently under evaluation for Repatha versus Praluent prior authorization and potentially Leqvio.  Right now this being done by her primary and neurology.  If not able to be successful, we can potentially try our lipid clinic.

## 2024-02-27 NOTE — Assessment & Plan Note (Signed)
 Blood pressure generally well-controlled, occasional high readings. Carvedilol discontinued. Alternative antihypertensive may be needed if blood pressure becomes uncontrolled.  Advised regular blood pressure monitoring. - Monitor blood pressure regularly. - Consider alternative antihypertensive if blood pressure becomes uncontrolled. - Given history of stroke and dizziness issues, would allow for mild permissive hypertension

## 2024-02-27 NOTE — Assessment & Plan Note (Signed)
 History of stroke. Advised to manage cholesterol to prevent recurrence. Discussed cost challenges of cholesterol-lowering medications and potential assistance programs. Advised on healthy diet for cholesterol management.  No evidence of A-fib on monitor Discussed options including Repatha, Praluent, Leqvio, and potential cost assistance through lipid clinic. - Explore assistance programs for cholesterol-lowering medications. - Maintain a healthy diet to support cholesterol management. - Continue aspirin  81 mg daily along with BP control using lisinopril 40 [place] daily and amlodipine 10 mg daily and HCTZ 25 mg daily. - For reasons discussed elsewhere, avoid beta-blocker.

## 2024-02-27 NOTE — Assessment & Plan Note (Signed)
 Significant heart rate variability with episodes of bradycardia and tachycardia. Carvedilol discontinued due to symptomatic bradycardia. Asymptomatic off beta blocker but at risk for future bradycardia. Discussed potential electrophysiologist referral if symptoms recur. Emphasized monitoring for dizziness or syncope. - Refer to electrophysiologist due to concerns of potential for high-grade AV block/sick sinus syndrome.. - Avoid beta blockers due to symptomatic bradycardia.

## 2024-03-14 ENCOUNTER — Other Ambulatory Visit: Payer: Self-pay | Admitting: Family Medicine

## 2024-03-14 DIAGNOSIS — Z1231 Encounter for screening mammogram for malignant neoplasm of breast: Secondary | ICD-10-CM

## 2024-05-01 ENCOUNTER — Ambulatory Visit
Admission: RE | Admit: 2024-05-01 | Discharge: 2024-05-01 | Disposition: A | Discharge status: Home / Self Care | Source: Ambulatory Visit | Attending: Family Medicine | Admitting: Family Medicine

## 2024-05-01 DIAGNOSIS — Z1231 Encounter for screening mammogram for malignant neoplasm of breast: Secondary | ICD-10-CM

## 2024-05-02 ENCOUNTER — Encounter: Payer: Self-pay | Admitting: Internal Medicine

## 2024-05-02 ENCOUNTER — Ambulatory Visit: Attending: Internal Medicine | Admitting: Internal Medicine

## 2024-05-02 VITALS — BP 131/87 | HR 101 | Ht 65.0 in | Wt 191.0 lb

## 2024-05-02 DIAGNOSIS — I639 Cerebral infarction, unspecified: Secondary | ICD-10-CM | POA: Diagnosis not present

## 2024-05-02 NOTE — Progress Notes (Signed)
 HPI Kiara Zamora is referred by Dr. Anner for evaluation of bradycardia and a cryptogenic stroke. She is a very pleasant 83 yo woman who sustained a stroke several months ago. She was noted to be bradycardic with sinus in the 30's. She was symptomatic. Her beta blocker was stopped and her symptoms resolved. She has occasional palpitations. She has never had documented atrial fib. She has occasional palpitations. She denies anginal symptoms. No syncope.   Allergies  Allergen Reactions   Ezetimibe Shortness Of Breath   Welchol [Colesevelam] Shortness Of Breath   Actonel [Risedronate] Other (See Comments)    Throat tightness   Atorvastatin Other (See Comments)    Joint pain/muscle aches   Pravastatin Other (See Comments)   Red Yeast Rice Extract Other (See Comments)    Joint pain/muscle aches    Sudafed [Pseudoephedrine Hcl] Itching    Severe itching   Penicillins Rash     Current Outpatient Medications  Medication Sig Dispense Refill   acetaminophen  (TYLENOL ) 500 MG tablet Take 1,000 mg by mouth every 6 (six) hours as needed (arthritis pain).     amLODipine (NORVASC) 10 MG tablet Take 1 tablet (10 mg total) by mouth daily.     aspirin  EC 81 MG tablet Take 1 tablet (81 mg total) by mouth daily. Swallow whole. 30 tablet 12   Cholecalciferol (VITAMIN D-3) 125 MCG (5000 UT) TABS Take 5,000 Units by mouth every morning.     Cyanocobalamin (VITAMIN B-12) 5000 MCG TBDP Take 5,000 mcg by mouth every morning.     hydrochlorothiazide (HYDRODIURIL) 25 MG tablet Take 25 mg by mouth daily.     lisinopril (ZESTRIL) 40 MG tablet Take 40 mg by mouth daily.     meclizine (ANTIVERT) 25 MG tablet Take 25 mg by mouth 3 (three) times daily as needed for dizziness.     Menthol, Topical Analgesic, (BIOFREEZE EX) Apply 1 application. topically daily as needed (knee pain).     Multiple Vitamins-Minerals (MULTIVITAL PO) Take 1 tablet by mouth daily.     omeprazole (PRILOSEC) 40 MG capsule Take 40 mg  by mouth daily.     solifenacin (VESICARE) 10 MG tablet Take 10 mg by mouth every morning.     No current facility-administered medications for this visit.     Past Medical History:  Diagnosis Date   Arthritis    Cancer (HCC)    Hyperlipidemia    Hypertension    Sleep apnea    CPAP    ROS:   All systems reviewed and negative except as noted in the HPI.   Past Surgical History:  Procedure Laterality Date   CHOLECYSTECTOMY     ESOPHAGOGASTRODUODENOSCOPY  06/05/2017   OOPHORECTOMY     UMBILICAL HERNIA REPAIR N/A 12/09/2021   Procedure: OPEN UMBILICAL HERNIA WITH MESH;  Surgeon: Kinsinger, Herlene Righter, MD;  Location: [facility];  Service: General;  Laterality: N/A;   VAGINAL HYSTERECTOMY       Family History  Problem Relation Age of Onset   Hypertension Mother    Esophageal cancer Mother    Colon cancer Father      Social History   Socioeconomic History   Marital status: Married    Spouse name: Not on file   Number of children: 3   Years of education: Not on file   Highest education level: Not on file  Occupational History   Not on file  Tobacco Use   Smoking status: Never   Smokeless tobacco:  Never  Vaping Use   Vaping status: Never Used  Substance and Sexual Activity   Alcohol use: No   Drug use: No   Sexual activity: Yes    Partners: Male  Other Topics Concern   Not on file  Social History Narrative   Lives at home with husband.    Social Drivers of Corporate investment banker Strain: Not on file  Food Insecurity: No Food Insecurity (11/21/2023)   Hunger Vital Sign    Worried About Running Out of Food in the Last Year: Never true    Ran Out of Food in the Last Year: Never true  Transportation Needs: No Transportation Needs (11/21/2023)   PRAPARE - Administrator, Civil Service (Medical): No    Lack of Transportation (Non-Medical): No  Physical Activity: Not on file  Stress: Not on file  Social Connections: Socially Integrated  (11/21/2023)   Social Connection and Isolation Panel    Frequency of Communication with Friends and Family: Three times a week    Frequency of Social Gatherings with Friends and Family: Three times a week    Attends Religious Services: More than 4 times per year    Active Member of Clubs or Organizations: Yes    Attends Banker Meetings: 1 to 4 times per year    Marital Status: Married  Catering manager Violence: Not At Risk (11/21/2023)   Humiliation, Afraid, Rape, and Kick questionnaire    Fear of Current or Ex-Partner: No    Emotionally Abused: No    Physically Abused: No    Sexually Abused: No     BP 131/87   Pulse (!) 101   Ht 5' 5 (1.651 m)   Wt 191 lb (86.6 kg)   SpO2 96%   BMI 31.78 kg/m   Physical Exam:  Well appearing 83 yo woman, NAD HEENT: Unremarkable Neck:  No JVD, no thyromegally Lymphatics:  No adenopathy Back:  No CVA tenderness Lungs:  Clear with no wheezes HEART:  Regular rate rhythm, no murmurs, no rubs, no clicks Abd:  soft, positive bowel sounds, no organomegally, no rebound, no guarding Ext:  2 plus pulses, no edema, no cyanosis, no clubbing Skin:  No rashes no nodules Neuro:  CN II through XII intact, motor grossly intact   Assess/Plan: SND - her symptoms have improved with cessation of her beta blocker. She will undergo watchful waiting. No indication for PPM at this time. Cryptogenic stroke - she did not have any atrial fib when she wore the 30 day monitor. I have recommended ILR insertion.  HTN - her bp is controlled. No change in symptoms.   Kiara Juno Alers,MD

## 2024-05-02 NOTE — Patient Instructions (Addendum)
 Medication Instructions:  Your physician recommends that you continue on your current medications as directed. Please refer to the Current Medication list given to you today.  *If you need a refill on your cardiac medications before your next appointment, please call your pharmacy*  Lab Work: None ordered.  You may go to any [facility] Location for your lab work:  [facility] - [address] ([facility]) - [phone] [address]  [place] - [address]   [place]  - [address]   [place] - [address] - [address] [employer]  - [address] - [address] ([facility]   If you have labs (blood work) drawn today and your tests are completely normal, you will receive your results only by: [employer] (if you have MyChart)  If you have any lab test that is abnormal or we need to change your treatment, we will call you or send a MyChart message to review the results.  Testing/Procedures: None ordered.  Follow-Up: At [facility], you and your health needs are our priority.  As part of our continuing mission to provide you with exceptional heart care, we have created designated Provider Care Teams.  These Care Teams include your primary Cardiologist (physician) and Advanced Practice Providers (APPs -  Physician Assistants and Nurse Practitioners) who all work together to provide you with the care you need, when you need it.  Your next appointment:   Oct 2 at [facility] for your Implantable loop recorder implant. Please come 20 minutes early to this appointment.  The format for your next appointment:   In Person  Provider:   Danelle Birmingham, MD

## 2024-05-30 ENCOUNTER — Institutional Professional Consult (permissible substitution): Admitting: Internal Medicine

## 2024-06-13 ENCOUNTER — Encounter: Payer: Self-pay | Admitting: Internal Medicine

## 2024-06-13 ENCOUNTER — Ambulatory Visit: Attending: Internal Medicine | Admitting: Internal Medicine

## 2024-06-13 VITALS — BP 140/96 | HR 96 | Ht 65.0 in | Wt 192.0 lb

## 2024-06-13 DIAGNOSIS — I639 Cerebral infarction, unspecified: Secondary | ICD-10-CM | POA: Diagnosis not present

## 2024-06-13 NOTE — Progress Notes (Signed)
 HPI Kiara Zamora returns today for followup. She is a pleasant 83 yo woman with a h/o cryptogenic stroke who I saw just over a month ago. She had documented sinus brady but no atrial fib. She returns today to undergo ILR insertion. Since her last visit, no change. She had stopped her beta blocker due to bradycardia. Her HR improved. She has previously worn a 30 day monitor which showed no atrial fib.  Allergies  Allergen Reactions   Ezetimibe Shortness Of Breath   Welchol [Colesevelam] Shortness Of Breath   Actonel [Risedronate] Other (See Comments)    Throat tightness   Atorvastatin Other (See Comments)    Joint pain/muscle aches   Pravastatin Other (See Comments)   Red Yeast Rice Extract Other (See Comments)    Joint pain/muscle aches    Sudafed [Pseudoephedrine Hcl] Itching    Severe itching   Penicillins Rash     Current Outpatient Medications  Medication Sig Dispense Refill   acetaminophen  (TYLENOL ) 500 MG tablet Take 1,000 mg by mouth every 6 (six) hours as needed (arthritis pain).     amLODipine (NORVASC) 10 MG tablet Take 1 tablet (10 mg total) by mouth daily.     aspirin  EC 81 MG tablet Take 1 tablet (81 mg total) by mouth daily. Swallow whole. 30 tablet 12   Cholecalciferol (VITAMIN D-3) 125 MCG (5000 UT) TABS Take 5,000 Units by mouth every morning.     Cyanocobalamin (VITAMIN B-12) 5000 MCG TBDP Take 5,000 mcg by mouth every morning.     hydrochlorothiazide (HYDRODIURIL) 25 MG tablet Take 25 mg by mouth daily.     lisinopril (ZESTRIL) 40 MG tablet Take 40 mg by mouth daily.     meclizine (ANTIVERT) 25 MG tablet Take 25 mg by mouth 3 (three) times daily as needed for dizziness.     Menthol, Topical Analgesic, (BIOFREEZE EX) Apply 1 application. topically daily as needed (knee pain).     Multiple Vitamins-Minerals (MULTIVITAL PO) Take 1 tablet by mouth daily.     omeprazole (PRILOSEC) 40 MG capsule Take 40 mg by mouth daily.     solifenacin (VESICARE) 10 MG tablet  Take 10 mg by mouth every morning.     No current facility-administered medications for this visit.     Past Medical History:  Diagnosis Date   Arthritis    Cancer (HCC)    Hyperlipidemia    Hypertension    Sleep apnea    CPAP    ROS:   All systems reviewed and negative except as noted in the HPI.   Past Surgical History:  Procedure Laterality Date   CHOLECYSTECTOMY     ESOPHAGOGASTRODUODENOSCOPY  06/05/2017   OOPHORECTOMY     UMBILICAL HERNIA REPAIR N/A 12/09/2021   Procedure: OPEN UMBILICAL HERNIA WITH MESH;  Surgeon: Kinsinger, Herlene Righter, MD;  Location: [facility];  Service: General;  Laterality: N/A;   VAGINAL HYSTERECTOMY       Family History  Problem Relation Age of Onset   Hypertension Mother    Esophageal cancer Mother    Colon cancer Father      Social History   Socioeconomic History   Marital status: Married    Spouse name: Not on file   Number of children: 3   Years of education: Not on file   Highest education level: Not on file  Occupational History   Not on file  Tobacco Use   Smoking status: Never   Smokeless tobacco: Never  Vaping Use   Vaping status: Never Used  Substance and Sexual Activity   Alcohol use: No   Drug use: No   Sexual activity: Yes    Partners: Male  Other Topics Concern   Not on file  Social History Narrative   Lives at home with husband.    Social Drivers of Corporate investment banker Strain: Not on file  Food Insecurity: No Food Insecurity (11/21/2023)   Hunger Vital Sign    Worried About Running Out of Food in the Last Year: Never true    Ran Out of Food in the Last Year: Never true  Transportation Needs: No Transportation Needs (11/21/2023)   PRAPARE - Administrator, Civil Service (Medical): No    Lack of Transportation (Non-Medical): No  Physical Activity: Not on file  Stress: Not on file  Social Connections: Socially Integrated (11/21/2023)   Social Connection and Isolation Panel    Frequency  of Communication with Friends and Family: Three times a week    Frequency of Social Gatherings with Friends and Family: Three times a week    Attends Religious Services: More than 4 times per year    Active Member of Clubs or Organizations: Yes    Attends Banker Meetings: 1 to 4 times per year    Marital Status: Married  Catering manager Violence: Not At Risk (11/21/2023)   Humiliation, Afraid, Rape, and Kick questionnaire    Fear of Current or Ex-Partner: No    Emotionally Abused: No    Physically Abused: No    Sexually Abused: No     There were no vitals taken for this visit.  Physical Exam:  Well appearing NAD HEENT: Unremarkable Neck:  No JVD, no thyromegally Lymphatics:  No adenopathy Back:  No CVA tenderness Lungs:  Clear HEART:  Regular rate rhythm, no murmurs, no rubs, no clicks Abd:  soft, positive bowel sounds, no organomegally, no rebound, no guarding Ext:  2 plus pulses, no edema, no cyanosis, no clubbing Skin:  No rashes no nodules Neuro:  CN II through XII intact, motor grossly intact    Assess/Plan: Cryptogenic stroke - she wishes to proceed with ILR insertion. SND - improved after cessation of her beta blocker. HTN - stable.   EP procedure noteSURGEON:  Danelle Birmingham, MD     PREPROCEDURE DIAGNOSIS:  Cryptogenic stroke    POSTPROCEDURE DIAGNOSIS: Cryptogenic stroke     PROCEDURES:   1. Implantable loop recorder implantation    INTRODUCTION:  Kiara Zamora presents with a history of cryptogenic stroke The costs of loop recorder monitoring have been discussed with the patient.    DESCRIPTION OF PROCEDURE:  Informed written consent was obtained.   Time Out Completed with Corean Ferri, RN    The patient required no sedation for the procedure today.  Mapping over the patient's chest was performed to identify the area where electrograms were most prominent for ILR recording.  This area was found to be the left parasternal region over  the 4th intercostal space. The patients left chest was therefore prepped and draped in the usual sterile fashion. The skin overlying the left parasternal region was infiltrated with lidocaine  for local analgesia.  A 0.5-cm incision was made over the left parasternal region over the 3rd intercostal space.  A subcutaneous ILR pocket was fashioned using a combination of sharp and blunt dissection.  A Medtronic Reveal LINQ 2 implantable loop recorder (serial # [id number]) was then placed into the  pocket  R waves were very prominent and measuring 1mV.  Steri- Strips and a sterile dressing were then applied.  There were no early apparent complications.     CONCLUSIONS:   1. Successful implantation of a implantable loop recorder for a history of cryptogenic stroke  2. No early apparent complications.   Danelle Birmingham, MD  Cardiac Electrophysiology

## 2024-06-13 NOTE — Patient Instructions (Addendum)
 Medication Instructions:  Your physician recommends that you continue on your current medications as directed. Please refer to the Current Medication list given to you today.  *If you need a refill on your cardiac medications before your next appointment, please call your pharmacy*  Lab Work: None ordered.  You may go to any [facility] Location for your lab work:  [facility] - [address] ([facility]) - [address] [phone] [address]  [place] - [address]   [place]  - [address]   [place] - [address] - [address] [facility] [facility]  - [address] - [address] ([facility]   If you have labs (blood work) drawn today and your tests are completely normal, you will receive your results only by: [employer] (if you have MyChart)  If you have any lab test that is abnormal or we need to change your treatment, we will call you or send a MyChart message to review the results.  Testing/Procedures: None ordered.  Follow-Up: Medication Instructions:  Your physician recommends that you continue on your current medications as directed. Please refer to the Current Medication list given to you today.  Labwork: None ordered.  Testing/Procedures: None ordered.       Implantable Loop Recorder Placement, Care After This sheet gives you information about how to care for yourself after your procedure. Your health care provider may also give you more specific instructions. If you have problems or questions, contact your health care provider. What can I expect after the procedure? After the procedure, it is common to have: Soreness or discomfort near the incision. Some swelling or bruising near the incision.  Follow these instructions at home: Incision care  Monitor your cardiac device site for redness, swelling, and drainage. Call the device  clinic at [phone] if you experience these symptoms or fever/chills.  Keep the large square bandage on your site for 3 days and then you may remove it yourself. Keep the steri-strips underneath in place.   You may shower after 4 days from your procedure with the steri-strips in place. They will usually fall off on their own, or may be removed after 10 days. Pat dry.   Avoid lotions, ointments, or perfumes over your incision until it is well-healed.  Please do not submerge in water until your site is completely healed.   Your device is MRI compatible.   Remote monitoring is used to monitor your cardiac device from home. This monitoring is scheduled every month by our office. It allows us  to keep an eye on the function of your device to ensure it is working properly.  If your wound site starts to bleed apply pressure.    For help with the monitor please call Medtronic Monitor Support Specialist directly at [phone].    If you have any questions/concerns please call the device clinic at [phone].  Activity  Return to your normal activities.  General instructions Follow instructions from your health care provider about how to manage your implantable loop recorder and transmit the information. Learn how to activate a recording if this is necessary for your type of device. You may go through a metal detection gate, and you may let someone hold a metal detector over your chest. Show your ID card if needed. Do not have an MRI unless you check with your health care provider  first. Take over-the-counter and prescription medicines only as told by your health care provider. Keep all follow-up visits as told by your health care provider. This is important. Contact a health care provider if: You have redness, swelling, or pain around your incision. You have a fever. You have pain that is not relieved by your pain medicine. You have triggered your device because of fainting (syncope)  or because of a heartbeat that feels like it is racing, slow, fluttering, or skipping (palpitations). Get help right away if you have: Chest pain. Difficulty breathing. Summary After the procedure, it is common to have soreness or discomfort near the incision. Change your dressing as told by your health care provider. Follow instructions from your health care provider about how to manage your implantable loop recorder and transmit the information. Keep all follow-up visits as told by your health care provider. This is important. This information is not intended to replace advice given to you by your health care provider. Make sure you discuss any questions you have with your health care provider. Document Released: 07/27/2015 Document Revised: 09/30/2017 Document Reviewed: 09/30/2017 Elsevier Patient Education  2020 [employer].   Your next appointment:   As needed

## 2024-07-15 ENCOUNTER — Ambulatory Visit: Attending: Internal Medicine

## 2024-07-15 DIAGNOSIS — I639 Cerebral infarction, unspecified: Secondary | ICD-10-CM | POA: Diagnosis not present

## 2024-07-15 LAB — CUP PACEART REMOTE DEVICE CHECK
Date Time Interrogation Session: 20251116093930
Implantable Pulse Generator Implant Date: 20251016

## 2024-07-17 ENCOUNTER — Ambulatory Visit: Payer: Self-pay | Admitting: Internal Medicine

## 2024-07-17 NOTE — Progress Notes (Signed)
 Remote Loop Recorder Transmission

## 2024-08-15 ENCOUNTER — Ambulatory Visit

## 2024-08-15 DIAGNOSIS — I639 Cerebral infarction, unspecified: Secondary | ICD-10-CM

## 2024-08-15 LAB — CUP PACEART REMOTE DEVICE CHECK
Date Time Interrogation Session: 20251218003416
Implantable Pulse Generator Implant Date: 20251016

## 2024-08-19 NOTE — Progress Notes (Signed)
 Remote Loop Recorder Transmission

## 2024-08-25 ENCOUNTER — Ambulatory Visit: Payer: Self-pay | Admitting: Internal Medicine

## 2024-09-15 ENCOUNTER — Ambulatory Visit

## 2024-09-15 DIAGNOSIS — I639 Cerebral infarction, unspecified: Secondary | ICD-10-CM | POA: Diagnosis not present

## 2024-09-16 ENCOUNTER — Ambulatory Visit

## 2024-09-16 LAB — CUP PACEART REMOTE DEVICE CHECK
Date Time Interrogation Session: 20260117234641
Implantable Pulse Generator Implant Date: 20251016

## 2024-09-17 ENCOUNTER — Ambulatory Visit: Payer: Self-pay | Admitting: Cardiology

## 2024-09-20 NOTE — Progress Notes (Signed)
 Remote Loop Recorder Transmission

## 2024-10-16 ENCOUNTER — Ambulatory Visit

## 2024-10-17 ENCOUNTER — Ambulatory Visit

## 2024-11-16 ENCOUNTER — Ambulatory Visit

## 2024-11-18 ENCOUNTER — Ambulatory Visit
